# Patient Record
Sex: Male | Born: 1965 | Race: White | Hispanic: No | Marital: Married | Smoking: Former smoker
Health system: Southern US, Community
[De-identification: ages and names within clinical notes are randomized; demographics above are authoritative.]

## PROBLEM LIST (undated history)

## (undated) DIAGNOSIS — R079 Chest pain, unspecified: Secondary | ICD-10-CM

## (undated) DIAGNOSIS — M549 Dorsalgia, unspecified: Secondary | ICD-10-CM

## (undated) DIAGNOSIS — E119 Type 2 diabetes mellitus without complications: Secondary | ICD-10-CM

## (undated) DIAGNOSIS — F32A Depression, unspecified: Secondary | ICD-10-CM

## (undated) DIAGNOSIS — M7702 Medial epicondylitis, left elbow: Secondary | ICD-10-CM

## (undated) DIAGNOSIS — L0292 Furuncle, unspecified: Secondary | ICD-10-CM

## (undated) DIAGNOSIS — N529 Male erectile dysfunction, unspecified: Secondary | ICD-10-CM

## (undated) DIAGNOSIS — Z720 Tobacco use: Secondary | ICD-10-CM

## (undated) DIAGNOSIS — J189 Pneumonia, unspecified organism: Secondary | ICD-10-CM

## (undated) DIAGNOSIS — R002 Palpitations: Secondary | ICD-10-CM

## (undated) DIAGNOSIS — Z9989 Dependence on other enabling machines and devices: Secondary | ICD-10-CM

## (undated) DIAGNOSIS — G43909 Migraine, unspecified, not intractable, without status migrainosus: Secondary | ICD-10-CM

## (undated) DIAGNOSIS — I10 Essential (primary) hypertension: Secondary | ICD-10-CM

## (undated) DIAGNOSIS — M109 Gout, unspecified: Secondary | ICD-10-CM

## (undated) DIAGNOSIS — E785 Hyperlipidemia, unspecified: Secondary | ICD-10-CM

## (undated) DIAGNOSIS — E669 Obesity, unspecified: Secondary | ICD-10-CM

## (undated) DIAGNOSIS — F329 Major depressive disorder, single episode, unspecified: Secondary | ICD-10-CM

## (undated) DIAGNOSIS — G4733 Obstructive sleep apnea (adult) (pediatric): Secondary | ICD-10-CM

## (undated) DIAGNOSIS — R972 Elevated prostate specific antigen [PSA]: Secondary | ICD-10-CM

## (undated) DIAGNOSIS — C61 Malignant neoplasm of prostate: Secondary | ICD-10-CM

## (undated) DIAGNOSIS — M7701 Medial epicondylitis, right elbow: Secondary | ICD-10-CM

## (undated) DIAGNOSIS — K219 Gastro-esophageal reflux disease without esophagitis: Secondary | ICD-10-CM

## (undated) HISTORY — PX: DENTAL SURGERY: SHX609

## (undated) HISTORY — PX: PROSTATE BIOPSY: SHX241

## (undated) HISTORY — DX: Obesity, unspecified: E66.9

## (undated) HISTORY — DX: Tobacco use: Z72.0

---

## 2008-08-11 ENCOUNTER — Ambulatory Visit: Payer: Self-pay | Admitting: Cardiology

## 2008-08-11 ENCOUNTER — Observation Stay (HOSPITAL_COMMUNITY): Admission: EM | Admit: 2008-08-11 | Discharge: 2008-08-12 | Payer: Self-pay | Admitting: Emergency Medicine

## 2009-12-19 ENCOUNTER — Emergency Department (HOSPITAL_COMMUNITY): Admission: EM | Admit: 2009-12-19 | Discharge: 2009-12-19 | Payer: Self-pay | Admitting: Emergency Medicine

## 2011-02-01 NOTE — H&P (Signed)
NAME:  Shawn Hicks, Shawn Hicks NO.:  [id number]   MEDICAL RECORD NO.:  [id number]          PATIENT TYPE:  OBV   LOCATION:  2004                         FACILITY:  [facility]   PHYSICIAN:  Arturo Morton. Riley Kill, MD, FACCDATE OF BIRTH:  1966-09-12   DATE OF ADMISSION:  08/11/2008  DATE OF DISCHARGE:                              HISTORY & PHYSICAL   CHIEF COMPLAINT:  Chest discomfort/agitation.   HISTORY OF PRESENT ILLNESS:  Last night, near midnight, the patient  became aware of a sensation in the substernal area of his chest.  It was  not a pain and nor was it associated with shortness of breath, no  radiation, however, it did interfere with his sleep and he was aware of  this pain all night.  He became agitated and this agitation is what has  brought him in to his primary care in the morning.  He reported at no  time during the evening did he have palpitations, dizziness, shortness  of breath, diaphoresis, nausea, or vomiting, got worse, this discomfort  was 2/10 and it came on at rest and spontaneously resolved in the a.m.   PAST MEDICAL HISTORY:  Hypertension.   ALLERGIES:  He has no known drug allergies.  No allergies of any kind.   MEDICATIONS:  1. Atenolol 25 mg daily.  2. Hydrochlorothiazide 25 mg daily.   SOCIAL HISTORY:  The patient lives in [place] with his wife.  They  have children, but they are grown and do not live at home.  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  He works full  time.  He is also active in the home, but has no regular exercise.  He  has a 30-pack year history of smoking, but quit yesterday.  He drinks 6-  8 alcoholic beverages a week.  No drugs in the last 5 years and prior to  that only marijuana.  His diet is regular.  No modification.  He does  not take any herbal medications.   FAMILY HISTORY:  His mother is deceased.  She died at 75 from liver  failure.  His father is living, only known medical history is  prostate  cancer.  He has 3 siblings with no known medical history.   REVIEW OF SYSTEMS:  The patient has intermittent night sweats for the  past several months, pain in his joints.  He has very, very brief  episodes of lasting less than 2 seconds of dizziness associated with  standing or sitting quickly.   PHYSICAL EXAMINATION:  VITAL SIGNS:  Temperature at 97.8 degrees  Fahrenheit, pulse of 55, respiration rate of 22, blood pressure was  133/85.  His oxygen saturation was 100% on 2 L via nasal cannula.  GENERAL:  He was alert and oriented x3.  He was in no apparent distress.  HEENT:  He was normocephalic and atraumatic.  His pupils were equal,  round, and reactive to light.  His extraocular muscles were intact.  NECK:  Supple without lymphadenopathy.  He had no thyroid megaly, no  bruits, and no JVD.  No  lymphadenopathy was noticed in any of his  extremities.  HEART:  His heart rate was regular rate and regular rhythm.  He had an  audible S1 and S2 with no murmurs, rubs, or gallops.  Pulses were 2+ and  equal without bruits in upper extremities and lower extremities  including carotid.  LUNGS:  Had mild wheezing bilaterally with right greater than left, but  significantly decreased after the patient coughed.  SKIN:  The patient had no rashes or skin lesion.  The patient was obese.  ABDOMEN:  Soft, nontender with normal abdominal sounds.  No rebound or  guarding, and no hepatosplenomegaly.  EXTREMITIES:  There was no clubbing, cyanosis, or edema.  No rashes,  lesions, or petechiae.  MUSCULOSKELETAL:  There is no joint deformity or effusions.  NEUROLOGIC:  He was A&O x3.  His cranial nerves II through XII were  grossly intact.  Strength was 5/5 in extremities and had normal  sensation throughout and showed no cerebellar dysfunction.   He had a chest x-ray that showed no active disease.  His EKG shows sinus  bradycardia with rate of 51, normal axis, PR was 166, QRS 84, QTc 423,  no  hypertrophy, no ST-T changes, and no Q waves.   LABORATORY DATA:  White blood cells 10, hemoglobin 14.8, hematocrit  43.4, platelets 163.  Sodium 138, potassium 4.0, chloride 105, carbon  dioxide 25, BUN 14, creatinine 1.1, glucose 104.  He had 1 set of  negative cardiac enzymes.  CK was 147, CK-MB was 3.3, and troponin I was  0.01.  APTT was 27 seconds, PT was 12.9, INR was 1.0.   ASSESSMENT AND PLAN:  The patient will be admitted to [facility] and had been  ruled out for acute coronary symptoms.  His admitting diagnosis be  unstable angina.  The patient will have a CMET, TSH, PT, INR.  He will  have 2 more sets of cardiac enzymes, lipid panel, one more EKG in the  morning and the patient will be scheduled for treadmill Myoview in the  a.m.  The patient also had extensive patient education regarding  lifestyle changes and encouragement to continue smoking cessation.  The  pathophysiology of coronary artery disease was also pointed out by Dr.  Riley Kill.  The patient was asked and had no question at that time;  however, he will be checked on for question from the future prior to  discharge.      Jarrett Ables, PAC      Arturo Morton. Riley Kill, MD, [facility]  Electronically Signed    MS/MEDQ  D:  08/11/2008  T:  08/12/2008  Job:  [id number]

## 2011-02-01 NOTE — Discharge Summary (Signed)
NAME:  Shawn Hicks, Shawn Hicks NO.:  [id number]   MEDICAL RECORD NO.:  [id number]          PATIENT TYPE:  OBV   LOCATION:  2004                         FACILITY:  [facility]   PHYSICIAN:  Verne Carrow, MDDATE OF BIRTH:  11-02-1965   DATE OF ADMISSION:  08/11/2008  DATE OF DISCHARGE:  08/12/2008                               DISCHARGE SUMMARY   The patient currently does not have a primary cardiologist.   PRIMARY CARE DOCTOR:  Dr. Pleas Koch.   DISCHARGE DIAGNOSIS:  Noncardiac chest pain, most likely secondary to  gastroesophageal reflux disease.   SECONDARY DIAGNOSIS:  Hypertension.   ALLERGIES:  The patient has no known drug allergies.   PROCEDURES PERFORMED DURING THIS HOSPITALIZATION:  The patient had an  electrocardiogram performed on August 12, 2008, which showed a normal  sinus rhythm at the rate of 64.  The patient had a chest x-ray that  showed no active disease.  On August 12, 2008, the patient had a  treadmill stress test with Myoview that was normal with hypertensive  response to exercise and an ejection fraction of 70%.   HISTORY OF PRESENT ILLNESS:  On the night of August 10, 2008, the  patient became aware of a sensation in his chest in the substernal area  that was nonradiating and nonassociated with shortness of breath,  nausea, palpitations, dizziness, and lasted throughout the night  interfering with his sleep and causing him to become more and more  agitated to the point where he felt it is necessary to see his primary  care Kanen Mottola in the morning.  The sensation in his chest started at  rest and resolved spontaneously in the morning just prior to going to  see his primary care Cashis Rill.  His primary care Latesha Chesney instructed him  to go to the emergency room and he was subsequently evaluated for acute  coronary syndrome at [facility].  The patient was stable  throughout the course of his hospital stay.  He had 3 sets of negative  cardiac enzymes and negative stress test and had no EKG changes.  He was  instructed upon his discharge on August 12, 2008, to follow up with  his primary care Otelia Hettinger as soon as it is convenient.  At the time of  his discharge, his systolic blood pressure was 121.  His diastolic blood  pressure was 73, temperature was 96, pulse 63, and respirations 20.  His  O2 saturation was 99% on room air.   LABORATORY VALUES:  White blood cell count was 10.0, hemoglobin was  14.8, hematocrit was 43.4, and platelet count was 163.  Protime was  12.9.  INR was 1.0.  PTT was 27.  Sodium was 138, potassium 4.0,  chloride 105, CO2 of 24, BUN 13, creatinine 0.91, glucose 106.  His  hemoglobin A1c was 6.0.  CK was 160.  CK-MB was 3.9 and 1.8.  Relative  index, troponin I was 2.02 on his last set of cardiac markers.  BNP was  244.0.  Total cholesterol was 158.  Triglyceride - LIPR 351.  Cholesterol, HDL was 24.  Cholesterol,  LDL was 64.  Cholesterol, VLDL  was 70.  TSH was 2.022.   FOLLOWUP APPOINTMENTS:  The patient was instructed to follow up with his  primary care physician.   DISCHARGE MEDICATIONS:  1. The patient was discharged on atenolol 25 mg daily.  2. Hydrochlorothiazide 25 mg daily.   OUTSTANDING LABS AND STUDIES:  None.   DURATION OF DISCHARGE/ENCOUNTER:  45 minutes according to physician  time.      Jarrett Ables, [facility]      Verne Carrow, MD  Electronically Signed    MS/MEDQ  D:  08/12/2008  T:  08/12/2008  Job:  [id number]   cc:   Dr. Pleas Koch

## 2011-06-21 LAB — LIPID PANEL
Cholesterol: 158
HDL: 24 — ABNORMAL LOW
Total CHOL/HDL Ratio: 6.6
Triglycerides: 351 — ABNORMAL HIGH

## 2011-06-21 LAB — CBC
HCT: 43.4
Platelets: 163
RDW: 12.8
WBC: 10

## 2011-06-21 LAB — COMPREHENSIVE METABOLIC PANEL
ALT: 39
Alkaline Phosphatase: 50
BUN: 13
Calcium: 8.7
Creatinine, Ser: 0.91
GFR calc Af Amer: >60
GFR calc non Af Amer: >60
Potassium: 4
Sodium: 138
Total Bilirubin: 0.6
Total Protein: 6.4

## 2011-06-21 LAB — POCT I-STAT, CHEM 8
BUN: 14
Creatinine, Ser: 1.1
HCT: 44
Hemoglobin: 15

## 2011-06-21 LAB — PROTIME-INR
INR: 1
Prothrombin Time: 12.9

## 2011-06-21 LAB — DIFFERENTIAL
Basophils Absolute: 0.1
Basophils Relative: 1
Eosinophils Absolute: 0.4
Eosinophils Relative: 4
Lymphs Abs: 3.6
Monocytes Absolute: 0.6
Monocytes Relative: 6
Neutro Abs: 5.2
Neutrophils Relative %: 53

## 2011-06-21 LAB — TROPONIN I: Troponin I: 0.01

## 2011-06-21 LAB — HEMOGLOBIN A1C: Hgb A1c MFr Bld: 6

## 2011-06-21 LAB — CK TOTAL AND CKMB (NOT AT ARMC): CK, MB: 3.3

## 2011-06-21 LAB — POCT CARDIAC MARKERS: Myoglobin, poc: 98

## 2011-06-21 LAB — CARDIAC PANEL(CRET KIN+CKTOT+MB+TROPI): CK, MB: 2.9

## 2013-07-17 ENCOUNTER — Encounter (HOSPITAL_COMMUNITY): Payer: Self-pay | Admitting: Emergency Medicine

## 2013-07-17 ENCOUNTER — Emergency Department (HOSPITAL_COMMUNITY)
Admission: EM | Admit: 2013-07-17 | Discharge: 2013-07-17 | Disposition: A | Discharge status: Home / Self Care | Payer: Managed Care, Other (non HMO) | Attending: Emergency Medicine | Admitting: Emergency Medicine

## 2013-07-17 DIAGNOSIS — R002 Palpitations: Secondary | ICD-10-CM | POA: Insufficient documentation

## 2013-07-17 DIAGNOSIS — Z79899 Other long term (current) drug therapy: Secondary | ICD-10-CM | POA: Insufficient documentation

## 2013-07-17 DIAGNOSIS — E871 Hypo-osmolality and hyponatremia: Secondary | ICD-10-CM

## 2013-07-17 DIAGNOSIS — E119 Type 2 diabetes mellitus without complications: Secondary | ICD-10-CM | POA: Insufficient documentation

## 2013-07-17 DIAGNOSIS — I10 Essential (primary) hypertension: Secondary | ICD-10-CM | POA: Insufficient documentation

## 2013-07-17 DIAGNOSIS — F17200 Nicotine dependence, unspecified, uncomplicated: Secondary | ICD-10-CM | POA: Insufficient documentation

## 2013-07-17 DIAGNOSIS — Z7982 Long term (current) use of aspirin: Secondary | ICD-10-CM | POA: Insufficient documentation

## 2013-07-17 HISTORY — DX: Type 2 diabetes mellitus without complications: E11.9

## 2013-07-17 HISTORY — DX: Essential (primary) hypertension: I10

## 2013-07-17 LAB — CBC WITH DIFFERENTIAL/PLATELET
Basophils Absolute: 0.1 10*3/uL (ref 0.0–0.1)
Eosinophils Absolute: 0.3 10*3/uL (ref 0.0–0.7)
Eosinophils Relative: 3 % (ref 0–5)
HCT: 45.5 % (ref 39.0–52.0)
Lymphs Abs: 3.9 10*3/uL (ref 0.7–4.0)
MCH: 32 pg (ref 26.0–34.0)
MCHC: 34.7 g/dL (ref 30.0–36.0)
MCV: 92.1 fL (ref 78.0–100.0)
Monocytes Absolute: 0.7 10*3/uL (ref 0.1–1.0)
Monocytes Relative: 7 % (ref 3–12)
Neutro Abs: 4.5 10*3/uL (ref 1.7–7.7)
Neutrophils Relative %: 48 % (ref 43–77)
Platelets: 179 10*3/uL (ref 150–400)
RBC: 4.94 MIL/uL (ref 4.22–5.81)
RDW: 13.3 % (ref 11.5–15.5)

## 2013-07-17 LAB — BASIC METABOLIC PANEL
BUN: 14 mg/dL (ref 6–23)
CO2: 22 mEq/L (ref 19–32)
Calcium: 9.4 mg/dL (ref 8.4–10.5)
Chloride: 96 mEq/L (ref 96–112)
Creatinine, Ser: 0.91 mg/dL (ref 0.50–1.35)
GFR calc Af Amer: >90 mL/min (ref >90)
GFR calc non Af Amer: >90 mL/min (ref >90)
Glucose, Bld: 122 mg/dL — ABNORMAL HIGH (ref 70–99)

## 2013-07-17 LAB — POCT I-STAT TROPONIN I: Troponin i, poc: 0 ng/mL (ref 0.00–0.08)

## 2013-07-17 NOTE — ED Notes (Signed)
Pt denies pain at this time. Does feel it in his chest when his heart skips a beat.

## 2013-07-17 NOTE — ED Notes (Signed)
Pt states that around 2am this morning after he got off work he started feeling like his heart was skipping beats and continued on throughout the day. Pt states he is on the edge of being dizzy but not dizzy, dizzy. Pt denies n/v or shob.

## 2013-07-17 NOTE — Progress Notes (Signed)
Patient confirms his pcp is DR. Rodrigo Ran of [facility].  System updated.

## 2013-07-17 NOTE — ED Provider Notes (Signed)
CSN: [id number]     Arrival date & time 07/17/13  1421 History   First MD Initiated Contact with Patient 07/17/13 1621     Chief Complaint  Patient presents with  . heart skipping beats    (Consider location/radiation/quality/duration/timing/severity/associated sxs/prior Treatment) The history is provided by the patient.  Shawn Hicks is a 47 y.o. male history of hypertension, diabetes here presenting with palpitations. The eye he has irregular heartbeat around 2 AM when he got off of work. Denies shortness of breath or worse palpitation with exertion. Has intermittent palpitations for some time. Had similar episodes before and had normal stress test. Denies chest pain or hx of CAD.    Past Medical History  Diagnosis Date  . Hypertension   . Diabetes mellitus without complication    History reviewed. No pertinent past surgical history. No family history on file. History  Substance Use Topics  . Smoking status: Current Every Day Smoker    Types: Cigars  . Smokeless tobacco: Never Used  . Alcohol Use: Yes     Comment: occasion    Review of Systems  Cardiovascular: Positive for palpitations.  All other systems reviewed and are negative.    Allergies  Review of patient's allergies indicates no known allergies.  Home Medications   Current Outpatient Rx  Name  Route  Sig  Dispense  Refill  . amLODipine (NORVASC) 5 MG tablet   Oral   Take 1 tablet by mouth daily.         Marland Kitchen aspirin 325 MG tablet   Oral   Take 325 mg by mouth daily.         Marland Kitchen lisinopril (PRINIVIL,ZESTRIL) 40 MG tablet   Oral   Take 40 mg by mouth daily.         . metFORMIN (GLUCOPHAGE) 500 MG tablet   Oral   Take 1 tablet by mouth daily.          Marland Kitchen testosterone (ANDROGEL) 50 MG/5GM GEL   Transdermal   Place 5 g onto the skin daily.          BP 126/75  Pulse 67  Temp(Src) 98.1 F (36.7 C) (Oral)  Resp 12  SpO2 100% Physical Exam  Nursing note and vitals  reviewed. Constitutional: He is oriented to person, place, and time. He appears well-developed and well-nourished.  HENT:  Head: Normocephalic.  Mouth/Throat: Oropharynx is clear and moist.  Eyes: Conjunctivae are normal. Pupils are equal, round, and reactive to light.  Neck: Normal range of motion. Neck supple.  Cardiovascular: Normal rate, regular rhythm and normal heart sounds.   Pulmonary/Chest: Effort normal and breath sounds normal. No respiratory distress. He has no wheezes. He has no rales.  Abdominal: Soft. Bowel sounds are normal. He exhibits no distension. There is no tenderness. There is no rebound and no guarding.  Musculoskeletal: Normal range of motion.  Neurological: He is alert and oriented to person, place, and time.  Skin: Skin is warm and dry.  Psychiatric: He has a normal mood and affect. His behavior is normal. Judgment and thought content normal.    ED Course  Procedures (including critical care time) Labs Review Labs Reviewed  BASIC METABOLIC PANEL - Abnormal; Notable for the following:    Sodium 129 (*)    Glucose, Bld 122 (*)    All other components within normal limits  CBC WITH DIFFERENTIAL  CBC WITH DIFFERENTIAL  CBC WITH DIFFERENTIAL  POCT I-STAT TROPONIN I   Imaging Review  No results found.  EKG Interpretation     Ventricular Rate:  76 PR Interval:  150 QRS Duration: 75 QT Interval:  349 QTC Calculation: 392 R Axis:   31 Text Interpretation:  Normal sinus rhythm No significant change since last tracing            MDM  No diagnosis found. Shawn Hicks is a 47 y.o. male here with palpitations. Will check electrolytes. He is not in heart failure. Stable to have workup outpatient.   6:09 PM Na 129. Likely from dehydration. Recommend outpatient holter monitor.    Richardean Canal, MD 07/17/13 [phone]

## 2013-07-31 ENCOUNTER — Ambulatory Visit (INDEPENDENT_AMBULATORY_CARE_PROVIDER_SITE_OTHER): Payer: Managed Care, Other (non HMO) | Admitting: Cardiovascular Disease

## 2013-07-31 ENCOUNTER — Encounter: Payer: Self-pay | Admitting: Cardiovascular Disease

## 2013-07-31 VITALS — BP 126/76 | HR 77 | Resp 15 | Ht 69.0 in | Wt 229.4 lb

## 2013-07-31 DIAGNOSIS — E871 Hypo-osmolality and hyponatremia: Secondary | ICD-10-CM | POA: Insufficient documentation

## 2013-07-31 DIAGNOSIS — E669 Obesity, unspecified: Secondary | ICD-10-CM

## 2013-07-31 DIAGNOSIS — R002 Palpitations: Secondary | ICD-10-CM

## 2013-07-31 NOTE — Progress Notes (Signed)
Patient ID: Shawn Hicks, male   DOB: 25-Jun-1966, 47 y.o.   MRN: [id number]      Reason for office visit Palpitations  Shawn Hicks is a moderately obese 47 year old man with hypertension, borderline diabetes mellitus and hypertriglyceridemia who was recently seen in the emergency room with palpitations. He describes the sensation very precise manner, like a skipped beat occurring about every fourth beat. He felt mildly dizzy. He was seen in the left lung emergency room. Laboratory tests are significant for hyponatremia (sodium 129) but were otherwise normal, and his echocardiogram showed which were a troponin contractions. He has some musculoskeletal chest discomfort related to an old injury and he fell down the stairs and had a fractured sternum. He does not describe anginal type chest pain but does have some shortness of breath which she attributes to obesity. 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 He denies presyncope, focal neurological complaints, intermittent claudication, lower extremity edema, erectile dysfunction or other cardiovascular issues. His palpitations resolved by the following morning and have not recurred. A few years ago he underwent a treadmill stress test that was normal   No Known Allergies  Current Outpatient Prescriptions  Medication Sig Dispense Refill  . amLODipine (NORVASC) 5 MG tablet Take 1 tablet by mouth daily.      Marland Kitchen aspirin 325 MG tablet Take 325 mg by mouth daily as needed.       Shawn Hicks Seed OIL 1 capsule by Does not apply route daily.      Marland Kitchen lisinopril (PRINIVIL,ZESTRIL) 40 MG tablet Take 40 mg by mouth daily.      . metFORMIN (GLUCOPHAGE) 500 MG tablet Take 1 tablet by mouth daily.       Marland Kitchen testosterone (ANDROGEL) 50 MG/5GM GEL Place 5 g onto the skin daily.       No current facility-administered medications for this visit.    Past Medical History  Diagnosis Date  .  Hypertension   . Diabetes mellitus without complication     No past surgical history on file.  Family History  Problem Relation Age of Onset  . Liver disease Mother   . Cancer Father   . Heart attack Father     History   Social History  . Marital Status: Married    Spouse Name: N/A    Number of Children: N/A  . Years of Education: N/A   Occupational History  . Not on file.   Social History Main Topics  . Smoking status: Current Every Day Smoker    Types: Cigars  . Smokeless tobacco: Never Used  . Alcohol Use: Yes     Comment: occasion  . Drug Use: No  . Sexual Activity: Not on file   Other Topics Concern  . Not on file   Social History Narrative  . No narrative on file    Review of systems: The patient specifically denies any chest pain at rest or with exertion, dyspnea at rest or with exertion, orthopnea, paroxysmal nocturnal dyspnea, syncope, focal neurological deficits, intermittent claudication, lower extremity edema, unexplained weight gain, cough, hemoptysis or wheezing.  The patient also denies abdominal pain, nausea, vomiting, dysphagia, diarrhea, constipation, polyuria, polydipsia, dysuria, hematuria, frequency, urgency, abnormal bleeding or bruising, fever, chills, unexpected weight changes, mood swings, change in skin or hair texture, change in voice quality, auditory or visual problems, allergic reactions or rashes, new musculoskeletal complaints other than usual "aches and  pains".   PHYSICAL EXAM BP 126/76  Pulse 77  Resp 15  Ht 5\' 9"  (1.753 m)  Wt 229 lb 6.4 oz (104.055 kg)  BMI 33.86 kg/m2  General: Alert, oriented x3, no distress, likely obese Head: no evidence of trauma, PERRL, EOMI, no exophtalmos or lid lag, no myxedema, no xanthelasma; normal ears, nose and oropharynx Neck: Very broad neck circumference, normal jugular venous pulsations and no hepatojugular reflux; brisk carotid pulses without delay and no carotid bruits Chest: clear to  auscultation, no signs of consolidation by percussion or palpation, normal fremitus, symmetrical and full respiratory excursions Cardiovascular: normal position and quality of the apical impulse, regular rhythm, normal first and second heart sounds, no murmurs, rubs or gallops Abdomen: no tenderness or distention, no masses by palpation, no abnormal pulsatility or arterial bruits, normal bowel sounds, no hepatosplenomegaly Extremities: no clubbing, cyanosis or edema; 2+ radial, ulnar and brachial pulses bilaterally; 2+ right femoral, posterior tibial and dorsalis pedis pulses; 2+ left femoral, posterior tibial and dorsalis pedis pulses; no subclavian or femoral bruits Neurological: grossly nonfocal   EKG: When seen in the emergency department the ECG was normal except for PACs; his ECG today is completely normal.  Lipid Panel     Component Value Date/Time   CHOL  Value: 158        ATP III CLASSIFICATION:  <200     mg/dL   Desirable  696-295  mg/dL   Borderline High  >=284    mg/dL   High 13/24/4010 2725   TRIG 351* 08/11/2008 1445   HDL 24* 08/11/2008 1445   CHOLHDL 6.6 08/11/2008 1445   VLDL 70* 08/11/2008 1445   LDLCALC  Value: 64        Total Cholesterol/HDL:CHD Risk Coronary Heart Disease Risk Table                     Men   Women  1/2 Average Risk   3.4   3.3 08/11/2008 1445    BMET    Component Value Date/Time   NA 129* 07/17/2013 1622   K 4.2 07/17/2013 1622   CL 96 07/17/2013 1622   CO2 22 07/17/2013 1622   GLUCOSE 122* 07/17/2013 1622   BUN 14 07/17/2013 1622   CREATININE 0.91 07/17/2013 1622   CALCIUM 9.4 07/17/2013 1622   GFRNONAA >90 07/17/2013 1622   GFRAA >90 07/17/2013 1622     ASSESSMENT AND PLAN Palpitations Fingers pretty good documentation that his palpitations are related to premature atrial contractions. I doubt we will find out more data by doing a 24-hour Holter monitor. The cause for his PACs is unclear. He doesn't describe symptoms that sound like atrial  fibrillation. He has an incomplete symptom complex for obstructive sleep apnea. I have recommended that we perform an echocardiogram to look at the size of his left and right atria and see if there is any evidence of pulmonary hypertension/cor pulmonale. I don't think I would investigate the premature beats any further if the echo is normal. We discussed the fact that the premature beats are more likely to happen during the hyperadrenergic state, excessive caffeine consumption, insufficient sleep etc.  Obesity Spent quite a while discussing his ideal body weight, the concept of body mass index and healthy ranges, waist to achieve weight loss, avoiding excessive carbohydrates (especially simple sugars and carbohydrates with a high glycemic index), types of exercise, etc. 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 It loss of the expected to have a highly beneficial effect   if his echocardiogram shows right heart enlargement, I recommend sleep study. Orders Placed This Encounter  Procedures  . EKG 12-Lead  . 2D Echocardiogram without contrast   Meds ordered this encounter  Medications  . Celery Seed OIL    Sig: 1 capsule by Does not apply route daily.    Junious Silk, MD, [facility] [phone] office [phone] pager

## 2013-07-31 NOTE — Patient Instructions (Signed)
Your physician has requested that you have an echocardiogram. Echocardiography is a painless test that uses sound waves to create images of your heart. It provides your doctor with information about the size and shape of your heart and how well your heart's chambers and valves are working. This procedure takes approximately one hour. There are no restrictions for this procedure.  Your physician recommends that you schedule a follow-up appointment if needed for abnormalities on the echocardiogram

## 2013-07-31 NOTE — Assessment & Plan Note (Addendum)
Spent quite a while discussing his ideal body weight, the concept of body mass index and healthy ranges, waist to achieve weight loss, avoiding excessive carbohydrates (especially simple sugars and carbohydrates with a high glycemic index), types of exercise, etc. 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 It loss of the expected to have a highly beneficial effect

## 2013-07-31 NOTE — Assessment & Plan Note (Addendum)
We have pretty good documentation that his palpitations are related to premature atrial contractions. I doubt we will find out more data by doing a 24-hour Holter monitor. The cause for his PACs is unclear. He doesn't describe symptoms that sound like atrial fibrillation. He has an incomplete symptom complex for obstructive sleep apnea. I have recommended that we perform an echocardiogram to look at the size of his left and right atria and see if there is any evidence of pulmonary hypertension/cor pulmonale. I don't think I would investigate the premature beats any further if the echo is normal. We discussed the fact that the premature beats are more likely to happen during the hyperadrenergic state, excessive caffeine consumption, insufficient sleep etc.

## 2013-08-26 ENCOUNTER — Other Ambulatory Visit (HOSPITAL_COMMUNITY): Payer: Self-pay | Admitting: Cardiovascular Disease

## 2013-08-26 ENCOUNTER — Ambulatory Visit (HOSPITAL_COMMUNITY)
Admission: RE | Admit: 2013-08-26 | Discharge: 2013-08-26 | Disposition: A | Discharge status: Home / Self Care | Payer: Managed Care, Other (non HMO) | Source: Ambulatory Visit | Attending: Cardiovascular Disease | Admitting: Cardiovascular Disease

## 2013-08-26 DIAGNOSIS — R0602 Shortness of breath: Secondary | ICD-10-CM

## 2013-08-26 DIAGNOSIS — R002 Palpitations: Secondary | ICD-10-CM

## 2013-08-26 DIAGNOSIS — E669 Obesity, unspecified: Secondary | ICD-10-CM | POA: Insufficient documentation

## 2013-08-26 DIAGNOSIS — I10 Essential (primary) hypertension: Secondary | ICD-10-CM | POA: Insufficient documentation

## 2013-08-26 DIAGNOSIS — R7309 Other abnormal glucose: Secondary | ICD-10-CM | POA: Insufficient documentation

## 2013-08-26 NOTE — Progress Notes (Signed)
2D Echo Performed 10/16/2012    Dawnmarie Breon, RCS  

## 2014-08-27 ENCOUNTER — Ambulatory Visit (INDEPENDENT_AMBULATORY_CARE_PROVIDER_SITE_OTHER): Payer: Managed Care, Other (non HMO) | Admitting: Physician Assistant

## 2014-08-27 ENCOUNTER — Encounter: Payer: Self-pay | Admitting: Physician Assistant

## 2014-08-27 VITALS — BP 110/80 | HR 80 | Ht 69.0 in | Wt 237.7 lb

## 2014-08-27 DIAGNOSIS — R079 Chest pain, unspecified: Secondary | ICD-10-CM | POA: Insufficient documentation

## 2014-08-27 DIAGNOSIS — E669 Obesity, unspecified: Secondary | ICD-10-CM

## 2014-08-27 DIAGNOSIS — Z720 Tobacco use: Secondary | ICD-10-CM | POA: Insufficient documentation

## 2014-08-27 DIAGNOSIS — I10 Essential (primary) hypertension: Secondary | ICD-10-CM

## 2014-08-27 DIAGNOSIS — E119 Type 2 diabetes mellitus without complications: Secondary | ICD-10-CM | POA: Insufficient documentation

## 2014-08-27 MED ORDER — NITROGLYCERIN 0.4 MG SL SUBL: 0.4000 mg | SUBLINGUAL_TABLET | SUBLINGUAL | Status: AC | PRN

## 2014-08-27 NOTE — Assessment & Plan Note (Signed)
Cessation advised. 

## 2014-08-27 NOTE — Assessment & Plan Note (Addendum)
Patient's chest pain is fairly constant and does not appear to be worsened with exertion. His risk factors include obesity, tobacco abuse, family history, diabetes mellitus.  We'll obtain a treadmill Myoview. Also provide him with some sublingual nitroglycerin. Just in case. He did have a negative d-dimer and EKG does not show any ischemic changes however, does show some left atrial enlargement.

## 2014-08-27 NOTE — Patient Instructions (Addendum)
Your physician recommends that you schedule a follow-up appointment in: 1 Month with [facility]  Your physician has requested that you have en exercise stress myoview. For further information please visit [email or web address]. Please follow instruction sheet, as given.

## 2014-08-27 NOTE — Addendum Note (Signed)
Addended by: Vennie Homans on: 08/27/2014 10:51 AM   Modules accepted: Orders

## 2014-08-27 NOTE — Assessment & Plan Note (Signed)
Metformin 

## 2014-08-27 NOTE — Progress Notes (Signed)
Patient ID: Shawn Hicks, male   DOB: 1966-09-02, 48 y.o.   MRN: [id number]    Date:  08/27/2014   ID:  Shawn Hicks, DOB 01/21/1966, MRN [id number]  PCP:  Shawn Ly, MD  Primary Cardiologist:  new     History of Present Illness: Shawn Hicks is a 48 y.o. male history of obesity, hypertension, diabetes mellitus, tobacco abuse. His father is currently 25 and does have coronary artery disease. Patient reports having chest pain over in the left axilla for approximately 1.5 weeks. It is 1-2 out of 10 and fairly constant. He does get busy forget these having it not notice it. Last Tuesday he did a lot of work a lot of lifting this past Monday did notice a dull ache in his left arm which resolved. Nothing particular makes the pain better or worse. He denies nausea, vomiting, diaphoresis, shortness of breath, radiation of pain.  He did report feeling just a little bit dizzy one day.  The patient currently denies  fever,  orthopnea, dizziness, PND, cough, congestion, abdominal pain, hematochezia, melena, lower extremity edema, claudication.  Wt Readings from Last 3 Encounters:  08/27/14 237 lb 11.2 oz (107.82 kg)  07/31/13 229 lb 6.4 oz (104.055 kg)     Past Medical History  Diagnosis Date  . Hypertension   . Diabetes mellitus without complication   . Obesity   . Tobacco abuse     Current Outpatient Prescriptions  Medication Sig Dispense Refill  . amLODipine (NORVASC) 5 MG tablet Take 1 tablet by mouth daily.    Marland Kitchen aspirin 325 MG tablet Take 325 mg by mouth daily as needed.     Marland Kitchen atorvastatin (LIPITOR) 40 MG tablet Take 1 tablet by mouth daily.    Antoine Poche Seed OIL 1 capsule by Does not apply route daily.    Marland Kitchen lisinopril (PRINIVIL,ZESTRIL) 40 MG tablet Take 40 mg by mouth daily.    . metFORMIN (GLUCOPHAGE) 500 MG tablet Take 1 tablet by mouth daily.     Marland Kitchen omeprazole (PRILOSEC) 20 MG capsule Take 1 capsule by mouth daily.     No current facility-administered medications for this  visit.    Allergies:   No Known Allergies  Social History:  The patient  reports that he has been smoking Cigars.  He has never used smokeless tobacco. He reports that he drinks alcohol. He reports that he does not use illicit drugs.   Family history:   Family History  Problem Relation Age of Onset  . Liver disease Mother   . Cancer Father   . Heart attack Father     ROS:  Please see the history of present illness.  All other systems reviewed and negative.   PHYSICAL EXAM: VS:  BP 110/80 mmHg  Pulse 80  Ht 5\' 9"  (1.753 m)  Wt 237 lb 11.2 oz (107.82 kg)  BMI 35.09 kg/m2 obese, well developed, in no acute distress HEENT: Pupils are equal round react to light accommodation extraocular movements are intact.  Neck: no JVDNo cervical lymphadenopathy. Cardiac: Regular rate and rhythm without murmurs rubs or gallops. Lungs:  clear to auscultation bilaterally, no wheezing,  mild rhonchi  Abd: soft, nontender, positive bowel sounds all quadrants, no hepatosplenomegaly Ext: no lower extremity edema.  2+ radial and dorsalis pedis pulses. Skin: warm and dry Neuro:  Grossly normal  EKG:  Normal sinus rhythm, atrial enlargement.  ASSESSMENT AND PLAN:  Problem List Items Addressed This Visit    Chest pain -  Primary    Patient's chest pain is fairly constant and does not appear to be worsened with exertion. His risk factors include obesity, tobacco abuse, family history, diabetes mellitus.  We'll obtain a treadmill Myoview. Also provide him with some sublingual nitroglycerin. Just in case. He did have a negative d-dimer and EKG does not show any ischemic changes however, does show some left atrial enlargement.      Relevant Orders      Myocardial Perfusion Imaging   Essential hypertension    Blood pressure well-controlled    Relevant Medications      atorvastatin (LIPITOR) 40 MG tablet   Obesity   Relevant Orders      Myocardial Perfusion Imaging   Type 2 diabetes mellitus     Metformin    Relevant Medications      atorvastatin (LIPITOR) 40 MG tablet

## 2014-08-27 NOTE — Assessment & Plan Note (Signed)
Blood pressure well controlled

## 2014-08-29 ENCOUNTER — Encounter: Payer: Self-pay | Admitting: Cardiovascular Disease

## 2014-09-10 ENCOUNTER — Telehealth: Payer: Self-pay | Admitting: Cardiovascular Disease

## 2014-09-10 NOTE — Telephone Encounter (Signed)
Close encounter 

## 2014-09-24 ENCOUNTER — Telehealth (HOSPITAL_COMMUNITY): Payer: Self-pay

## 2014-09-24 NOTE — Telephone Encounter (Signed)
Encounter complete. 

## 2014-09-26 ENCOUNTER — Encounter (HOSPITAL_COMMUNITY): Payer: Managed Care, Other (non HMO)

## 2014-10-03 ENCOUNTER — Ambulatory Visit: Payer: Managed Care, Other (non HMO) | Admitting: Physician Assistant

## 2014-10-31 ENCOUNTER — Ambulatory Visit: Payer: Managed Care, Other (non HMO) | Admitting: Physician Assistant

## 2014-11-07 ENCOUNTER — Encounter: Payer: Self-pay | Admitting: Neurology

## 2014-11-10 ENCOUNTER — Ambulatory Visit (INDEPENDENT_AMBULATORY_CARE_PROVIDER_SITE_OTHER): Payer: Managed Care, Other (non HMO) | Admitting: Neurology

## 2014-11-10 ENCOUNTER — Encounter: Payer: Self-pay | Admitting: Neurology

## 2014-11-10 VITALS — BP 110/64 | HR 72 | Resp 21 | Ht 68.0 in | Wt 240.0 lb

## 2014-11-10 DIAGNOSIS — R0683 Snoring: Secondary | ICD-10-CM | POA: Insufficient documentation

## 2014-11-10 DIAGNOSIS — I10 Essential (primary) hypertension: Secondary | ICD-10-CM | POA: Insufficient documentation

## 2014-11-10 DIAGNOSIS — G4726 Circadian rhythm sleep disorder, shift work type: Secondary | ICD-10-CM | POA: Insufficient documentation

## 2014-11-10 DIAGNOSIS — G4719 Other hypersomnia: Secondary | ICD-10-CM

## 2014-11-10 NOTE — Progress Notes (Signed)
[facility]   Provider:  Larey Seat, M D  Referring Provider: Jerlyn Ly, MD Primary Care Physician:  Shawn Ly, MD  Chief Complaint  Patient presents with  . Snoring    Rm 11 New Patient - Works hours 3:30 pm untill 2:00 am     HPI:  Shawn Hicks is a 49 y.o. male ,  seen here as a referral from Dr. Joylene Hicks for a sleep consultation , 11-10-14 Mr.  Hicks  is also followed by Shawn Hicks, whom he recently saw for an perceived atypical chest pain.  He is a current every day smoker, was diagnosed with bronchitis, sinusitis just  a couple of weeks ago.  The patient states that he has been snoring for many years perhaps decades, and that he snores louder when he had a couple of drinks which is expected. 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 He has broken his nose a couple of times in his late teens, some of these alcohol related.  His wife has been aware of his snoring as long as they're married, but until recently also not aware that this could be relating to a health problem. [name] states  he became excessively daytime sleepy; when he sits down to watch TV, he dozes off. He falls asleep whenever not stimulated and physically inactive.  He noted longer distance driving puts him into drowsiness and he at work he is greateful for being  physically active, he fell asleep in a meeting, though.   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 Mo through Friday for the last 9 years . 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 He never had trouble sleeping.  He usually goes to bed between 4 and 4 .30 AM and is asleep immediately, has one nocturia at night. He fells best sleeping  Prone, moves a lot , wakes up on his back. He has back pain. He uses an alarm for 1 PM and gets ready for work.  The patient reports having a lot of un-scheduled naps in daytime and often feels that he  is going to dream quite easily.  He has vivid dreams at night as well. Most of them are pleasant not nightmarish in character. He has not been a sleep walker, acting out dreams, or  yelling , nor sleep eating .  No children live in the house. His bedroom is cool, quiet ( fan running  ) and dark.  He would consider himself not an early bird , except  on vacation he actually enjoys getting up early and enjoy the full time of being off.  He has coffee when he gets up, 1-2 cups.   His is currently he is smoking cigars about 3 a day cutting down. On weekends he will drink alcohol whiskey he orally on Monday nights will drink bourbon by watching his favorite TV show,  no history of any illicit drug use. One brother and 2 sisters, all his senior, all living and nobody with diagnosed with OSA, his father had a Heart attack and had a stent placed, doing well., prostrate cancer .   His mother was an alcoholic and died of complications from liver disease.    He had no facial, neck or ENT surgeries.    Review of Systems: Out of a complete 14 system  review, the patient complains of only the following symptoms, and all other reviewed systems are negative.  chest pain, arm pain on the left- recovered after chiropractic adjustment. Coughing.   Epworth score  17 , Fatigue severity score 38  , depression score 1   History   Social History  . Marital Status: Married    Spouse Name: Shawn Hicks  . Number of Children: 0  . Years of Education: college   Occupational History  . Not on file.   Social History Main Topics  . Smoking status: Current Every Day Smoker    Types: Cigars  . Smokeless tobacco: Never Used  . Alcohol Use: 1.8 oz/week    3 Shots of liquor per week     Comment: On Monday's after work  . Drug Use: No  . Sexual Activity: Not on file   Other Topics Concern  . Not on file   Social History Narrative   Patient lives at home with his wife Shawn Hicks) and his father.   Patient works full time  Works hours 3:00 pm until 2:oo am every night.   Education college.    Right handed.   Caffeine Two cups of coffee daily and one mountain dew daily.    Family History  Problem Relation Age of Onset  . Liver disease Mother   . Cancer Father   . Heart attack Father     Past Medical History  Diagnosis Date  . Hypertension   . Diabetes mellitus without complication   . Obesity   . Tobacco abuse     Past Surgical History  Procedure Laterality Date  . Dental surgery      Current Outpatient Prescriptions  Medication Sig Dispense Refill  . amLODipine (NORVASC) 5 MG tablet Take 1 tablet by mouth daily.    Marland Kitchen aspirin 325 MG tablet Take 81 mg by mouth daily as needed.     Marland Kitchen atorvastatin (LIPITOR) 40 MG tablet Take 1 tablet by mouth daily.    Shawn Hicks Seed OIL 1 capsule by Does not apply route daily.    . colchicine 0.6 MG tablet Take 0.6 mg by mouth daily.    Marland Kitchen lisinopril (PRINIVIL,ZESTRIL) 40 MG tablet Take 40 mg by mouth daily.    . metFORMIN (GLUCOPHAGE) 500 MG tablet Take 1 tablet by mouth daily.     . Misc Natural Products (BLACK CHERRY CONCENTRATE PO) Take by mouth daily.    . nitroGLYCERIN (NITROSTAT) 0.4 MG SL tablet Place 1 tablet (0.4 mg total) under the tongue every 5 (five) minutes as needed for chest pain. 25 tablet 3  . omeprazole (PRILOSEC) 20 MG capsule Take 1 capsule by mouth daily.     No current facility-administered medications for this visit.    Allergies as of 11/10/2014  . (No Known Allergies)    Vitals: BP 110/64 mmHg  Pulse 72  Resp 21  Ht 5\' 8"  (1.727 m)  Wt 240 lb (108.863 kg)  BMI 36.50 kg/m2 Last Weight:  Wt Readings from Last 1 Encounters:  11/10/14 240 lb (108.863 kg)       Last Height:   Ht Readings from Last 1 Encounters:  11/10/14 5\' 8"  (1.727 m)    Physical exam:  General: The patient is awake, alert and appears not in acute distress. The patient is well groomed. Head: Normocephalic, atraumatic. Neck is supple. Mallampati 3 , very  red  Mucous lining.   neck circumference: 18 Nasal airflow restricted, congested, coughing, bronchial breathing, TMJ is  Not  evident .  Postnasal drip, Retrognathia is seen. Full dentures on top for 5 years.  Cardiovascular:  Regular rate and rhythm , without  murmurs or carotid bruit, and without distended neck veins. Respiratory: Lungs are clear to auscultation. Skin:  Without evidence of edema, or rash Trunk: BMI is elevated but  patient has normal posture.  Neurologic exam : The patient is awake and alert, oriented to place and time.   Memory subjective described as intact. There is a normal attention span & concentration ability.  Speech is fluent without dysarthria, but dysphonia . Mood and affect are appropriate.  Cranial nerves: Pupils are equal and briskly reactive to light. Funduscopic exam without evidence of pallor or edema.  Extraocular movements  in vertical and horizontal planes intact and without nystagmus. Visual fields by finger perimetry are intact. Hearing to finger rub intact.  Facial sensation intact to fine touch. Facial motor strength is symmetric and tongue and uvula move midline.  Motor exam:  Normal tone, muscle bulk and symmetric ,strength in all extremities.  Sensory:  Fine touch, pinprick and vibration were tested in all extremities. Proprioception is tested in the upper extremities only. This was normal.  Coordination: Rapid alternating movements in the fingers/hands is normal. Finger-to-nose maneuver  normal without evidence of ataxia, dysmetria or tremor.  Gait and station: Patient walks without assistive device and is able unassisted to climb up to the exam table. Strength within normal limits. Stance is stable and normal.  Tandem gait is unfragmented.  Deep tendon reflexes: in the  upper and lower extremities are symmetric and intact. Babinski maneuver response is downgoing.   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 This is quite common in shift workers that have to sleep in daytime when outside activities cost noise, light or any other interruptions that they're not always aware of.   #2 he has risk factors for obstructive sleep apnea, BMI, retrognathia ,  and currently suffers from an upper airway infection and inflammation that forces him even more to breathe through his mouth. He  has swelling of the oral and pharyngeal tissues. This may not be his baseline sleep breathing pattern that I see today. I think currently he is likely to not just snore but have some obstruction and apnea as well.  #3 he is still smoking also on a very limited scale. Consider that he has a many year smoking history he may have some alveolar hypoventilation. I will order for this patient a split-night polysomnography study with CO2 measures, he is insured through [employer] and I will ask for a split-night at 15 AHI, scoring at 3%, main reason excessive hypersomnia associated with snoring and a shift worker.  I explained to this patient that obstructive sleep apnea can be a risk factor for heart disease and since he recently had an evaluation for chest pain  (which turned out not to be cardiac induced) he came to our radar screen.   The patient was advised of the nature of the diagnosed sleep disorder , the treatment options and risks for general a health and wellness arising from not treating the condition. Consult duration was 30  minutes.   Plan:  Treatment plan and additional  workup : SPLIT .      Asencion Partridge Morad Tal MD  11/10/2014

## 2014-11-10 NOTE — Patient Instructions (Signed)
Sleep Apnea  Sleep apnea is disorder that affects a person's sleep. A person with sleep apnea has abnormal pauses in their breathing when they sleep. It is hard for them to get a good sleep. This makes a person tired during the day. It also can lead to other physical problems. There are three types of sleep apnea. One type is when breathing stops for a short time because your airway is blocked (obstructive sleep apnea). Another type is when the brain sometimes fails to give the normal signal to breathe to the muscles that control your breathing (central sleep apnea). The third type is a combination of the other two types.  HOME CARE  · Do not sleep on your back. Try to sleep on your side.  · Take all medicine as told by your doctor.  · Avoid alcohol, calming medicines (sedatives), and depressant drugs.  · Try to lose weight if you are overweight. Talk to your doctor about a healthy weight goal.  Your doctor may have you use a device that helps to open your airway. It can help you get the air that you need. It is called a positive airway pressure (PAP) device. There are three types of PAP devices:  · Continuous positive airway pressure (CPAP) device.  · Nasal expiratory positive airway pressure (EPAP) device.  · Bilevel positive airway pressure (BPAP) device.  MAKE SURE YOU:  · Understand these instructions.  · Will watch your condition.  · Will get help right away if you are not doing well or get worse.  Document Released: 06/14/2008 Document Revised: 08/22/2012 Document Reviewed: 01/07/2012  [employer]® Patient Information ©2015 [employer]. This information is not intended to replace advice given to you by your health care provider. Make sure you discuss any questions you have with your health care provider.

## 2014-12-29 ENCOUNTER — Other Ambulatory Visit: Payer: Self-pay | Admitting: Neurology

## 2014-12-29 DIAGNOSIS — G4733 Obstructive sleep apnea (adult) (pediatric): Secondary | ICD-10-CM

## 2015-01-02 ENCOUNTER — Encounter: Payer: Managed Care, Other (non HMO) | Admitting: *Deleted

## 2016-11-30 ENCOUNTER — Telehealth: Payer: Self-pay | Admitting: Student

## 2016-11-30 NOTE — Telephone Encounter (Signed)
Received records from [facility] for appointment on 12/12/16 with Bernerd Pho, [place].  Records put with Brittany's schedule for 12/12/16. lp

## 2016-12-01 ENCOUNTER — Telehealth: Payer: Self-pay

## 2016-12-01 DIAGNOSIS — R0683 Snoring: Secondary | ICD-10-CM

## 2016-12-01 NOTE — Telephone Encounter (Addendum)
This patient had consult with Dr. Brett Fairy on 11/10/2014 , he cancelled a scheduled split and scheduled home study.  I now have a new referral for him, can orders be put back in or does he need to schedule another consult?

## 2016-12-01 NOTE — Telephone Encounter (Signed)
I would be willing to just re order the HST, but if he not shows for this one, He is dismissed.  Does he have an appointment already? CD

## 2016-12-01 NOTE — Telephone Encounter (Signed)
I can put order back in, unless insurance changed. CD

## 2016-12-11 NOTE — Progress Notes (Signed)
Cardiology Office Note    Date:  12/12/2016   ID:  Shawn Hicks, DOB January 01, 1966, MRN [id number]  PCP:  Jerlyn Ly, MD  Cardiologist: Dr. Sallyanne Kuster  Chief Complaint  Patient presents with  . Follow-up    chest pain, and palpitations, no other complaints    History of Present Illness:    Shawn Hicks is a 51 y.o. male with past medical history of HTN, Type 2 DM, and tobacco use who presents to the office today for evaluation of chest pain and palpitations.   He was last seen by Tarri Fuller, PA-C in 08/2014 and reported having constant low-grade chest pain for the past 1.5 weeks. EKG was without acute ischemic changes. A Treadmill Myoview was ordered but this was never performed.   Was seen by his PCP on 11/30/2016 and reported having a "weird" sensation in his chest which started 2-3 days after taking Xigduo. Reported associated dizziness and a fullness along his fingertips. Labs were obtained at that time and showed WBC 10.1, Hgb 18.3, platelets 222. Creatinine 1.0, K+ 4.5. EKG showed NSR, HR 79, with no acute ST or T-wave changes.   In talking with the patient today, he reports having episodes of chest discomfort for the past 2 weeks. He describes this as a dull pain which usually occurs with rest. The pain can last for several minutes up to an hour and gradually resides. He denies any exertional chest pain and notes his current pain is noticeable when siting upright or lying down, actually improving with exertion. Denies any associated dyspnea, nausea, vomiting, or diaphoresis.  He also reports having palpitations occurring during this time frame as well. Says he can feel his heart "skip beats" which he notices mostly at night when it is quiet. He denies any associated lightheadedness, dizziness, or presyncope.   He denies any orthopnea, PND, or lower extremity edema.   He did recently quit smoking cigars to see if this would help with his symptoms, which has not helped. He was  advised to continue cessation of tobacco products.   The patient also reports he quit taking his Xigduo for the past week to see if this would improve his symptoms and he has not noticed any recent changes. Was informed to resume this and follow-up with his PCP if he wishes to switch to a different medication.    Past Medical History:  Diagnosis Date  . Diabetes mellitus without complication ([place])   . Hypertension   . Obesity   . Tobacco abuse     Past Surgical History:  Procedure Laterality Date  . DENTAL SURGERY      Current Medications: Outpatient Medications Prior to Visit  Medication Sig Dispense Refill  . amLODipine (NORVASC) 5 MG tablet Take 1 tablet by mouth daily.    Marland Kitchen aspirin 325 MG tablet Take 81 mg by mouth daily as needed.     Marland Kitchen atorvastatin (LIPITOR) 40 MG tablet Take 1 tablet by mouth daily.    Antoine Poche Seed OIL 1 capsule by Does not apply route daily.    . colchicine 0.6 MG tablet Take 0.6 mg by mouth daily.    Marland Kitchen lisinopril (PRINIVIL,ZESTRIL) 40 MG tablet Take 40 mg by mouth daily.    . metFORMIN (GLUCOPHAGE) 500 MG tablet Take 1 tablet by mouth daily.     . Misc Natural Products (BLACK CHERRY CONCENTRATE PO) Take by mouth daily.    . nitroGLYCERIN (NITROSTAT) 0.4 MG SL tablet Place 1 tablet (  0.4 mg total) under the tongue every 5 (five) minutes as needed for chest pain. 25 tablet 3  . omeprazole (PRILOSEC) 20 MG capsule Take 1 capsule by mouth daily.     No facility-administered medications prior to visit.      Allergies:   Patient has no known allergies.   Social History   Social History  . Marital status: Married    Spouse name: Vicky  . Number of children: 0  . Years of education: college   Social History Main Topics  . Smoking status: Current Every Day Smoker    Types: Cigars  . Smokeless tobacco: Never Used  . Alcohol use 1.8 oz/week    3 Shots of liquor per week     Comment: On Monday's after work  . Drug use: No  . Sexual activity: Not  Asked   Other Topics Concern  . None   Social History Narrative   Patient lives at home with his wife Jocelyn Lamer) and his father.   Patient works full time Works hours 3:00 pm until 2:oo am every night.   Education college.    Right handed.   Caffeine Two cups of coffee daily and one mountain dew daily.     Family History:  The patient's family history includes Cancer in his father; Heart attack in his father; Liver disease in his mother; Sleep apnea in his brother.   Review of Systems:   Please see the history of present illness.     General:  No chills, fever, night sweats or weight changes.  Cardiovascular:  No dyspnea on exertion, edema, orthopnea, paroxysmal nocturnal dyspnea. Positive for chest pain and palpitations.  Dermatological: No rash, lesions/masses Respiratory: No cough, dyspnea Urologic: No hematuria, dysuria Abdominal:   No nausea, vomiting, diarrhea, bright red blood per rectum, melena, or hematemesis Neurologic:  No visual changes, wkns, changes in mental status. All other systems reviewed and are otherwise negative except as noted above.   Physical Exam:    VS:  BP 127/78   Pulse 64   Ht 5\' 9"  (1.753 m)   Wt 227 lb (103 kg)   SpO2 99%   BMI 33.52 kg/m    General: Well developed, well nourished Caucasian male appearing in no acute distress. Head: Normocephalic, atraumatic, sclera non-icteric, no xanthomas, nares are without discharge.  Neck: No carotid bruits. JVD not elevated.  Lungs: Respirations regular and unlabored, without wheezes or rales.  Heart: Regular rate and rhythm. No S3 or S4.  No murmur, no friction rubs, or gallops appreciated. Abdomen: Soft, non-tender, non-distended with normoactive bowel sounds. No hepatomegaly. No rebound/guarding. No obvious abdominal masses. Msk:  Strength and tone appear normal for age. No joint deformities or effusions. Extremities: No clubbing or cyanosis. No edema.  Distal pedal pulses are 2+ bilaterally. Neuro:  Alert and oriented X 3. Moves all extremities spontaneously. No focal deficits noted. Psych:  Responds to questions appropriately with a normal affect. Skin: No rashes or lesions noted  Wt Readings from Last 3 Encounters:  12/12/16 227 lb (103 kg)  11/10/14 240 lb (108.9 kg)  08/27/14 237 lb 11.2 oz (107.8 kg)      Studies/Labs Reviewed:   EKG:  EKG is ordered today. The ekg ordered today demonstrates NSR, HR 64, no acute ST or T-wave changes when compared to prior tracings.   Recent Labs: No results found for requested labs within last 8760 hours.   Additional studies/ records that were reviewed today include:   NST:  01/2009 The patient exercised for 7 minutes and 3 seconds on a standard  Bruce protocol. Exercise was stopped due to fatigue. Maximal  heart rate was 150. Peak blood pressure was 209/74. Resting EKG  was normal. With stress there is no ischemia.     Scintigraphic results: Images reinsert the short axis vertical  horizontal long axis. There is significant motion artifact. There  appear to be left ventricular hypertrophy. There is no evidence of  ischemia or infarction. Surface imaging showed normal ejection  fraction of 70%.     Final impression: Normal stress Myoview with hypertensive response  to exercise. Ejection fraction 70%  Echocardiogram: 08/2013 Study Conclusions  Left ventricle: The cavity size was normal. Systolic function was normal. The estimated ejection fraction was in the range of 60% to 65%. Wall motion was normal; there were no regional wall motion abnormalities.  Assessment:    1. Chest pain, unspecified type   2. Pleuritic chest pain   3. Palpitations   4. Essential hypertension   5. Type 2 diabetes mellitus without complication, without long-term current use of insulin ([place])   6. Tobacco abuse      Plan:   In order of problems listed above:  1. Pleuritic Chest Pain - he reports having episodes of chest discomfort for the  past 2 weeks which he describes as a dull ache which can last for several minutes up to an hour and gradually resides. Pain is brought on by siting upright or lying down, actually improving with exertion. Denies any associated dyspnea, nausea, vomiting, or diaphoresis - EKG today is without any acute ischemic changes. His symptoms seem atypical for ACS as they can last for over an hour and are improved with exertion however he does have several cardiac risk factors including HTN, Type 2 DM, and family history of CAD.  - will obtain an echocardiogram to assess for structural abnormalities of the heart and to rule out a pericardial effusion. He has in-date SL NTG and instructions for taking this were reviewed with the patient. Was informed if he develops persistent chest pain or exertional symptoms, then he should proceed to the nearest ED.   2. Palpitations - reports episodes of his heart "skipping beats" along with his heart racing. Episodes occur 1-2 times per week. No associated lightheadedness, dizziness, or presyncope. Recent labs reviewed with stable electrolytes. Hgb 18.3. Reports TSH was checked at his annual appointment and was "normal".  - Will obtain a 30-day cardiac event monitor to assess for arrhythmias.   3. Essential HTN - BP well-controlled at 127/78 during today's visit. - reports good compliance with his Lisinopril. Has not been taking Amlodipine due to episodes of hypotension. Was encouraged to continue to check BP closely and resume Amlodipine if SBP > 140 or DBP > 90.  4. Type 2 DM - he quit taking Xigduo for the past week to see if this would improve his symptoms and he has not noticed any recent changes.  - informed the patient to resume this and follow-up with his PCP for alternatives to this medication if he does not wish to remain on it.   5. Tobacco Use - quit smoking cigarettes 2+ years ago and quit using cigars last week.  - continued cessation advised.      Medication Adjustments/Labs and Tests Ordered: Current medicines are reviewed at length with the patient today.  Concerns regarding medicines are outlined above.  Medication changes, Labs and Tests ordered today are listed in the  Patient Instructions below. Patient Instructions  Medication Instructions:  Your physician recommends that you continue on your current medications as directed. Please refer to the Current Medication list given to you today.  Labwork: None   Testing/Procedures: Your physician has requested that you have an echocardiogram. Echocardiography is a painless test that uses sound waves to create images of your heart. It provides your doctor with information about the size and shape of your heart and how well your heart's chambers and valves are working. This procedure takes approximately one hour. There are no restrictions for this procedure.  Your physician has recommended that you wear an 30 DAY event monitor. Event monitors are medical devices that record the heart's electrical activity. Doctors most often [place] these monitors to diagnose arrhythmias. Arrhythmias are problems with the speed or rhythm of the heartbeat. The monitor is a small, portable device. You can wear one while you do your normal daily activities. This is usually used to diagnose what is causing palpitations/syncope (passing out).  Follow-Up: Your physician recommends that you schedule a follow-up appointment in: [address] [place].  Any Other Special Instructions Will Be Listed Below (If Applicable).  If you need a refill on your cardiac medications before your next appointment, please call your pharmacy.   Signed, Erma Heritage, PA  12/12/2016 1:39 PM    [facility] [place], [place], [place]  [address] Phone: [phone]; Fax: [phone]  [address] [place], [place] [address] Phone: [phone]

## 2016-12-12 ENCOUNTER — Ambulatory Visit (INDEPENDENT_AMBULATORY_CARE_PROVIDER_SITE_OTHER): Payer: Managed Care, Other (non HMO) | Admitting: Student

## 2016-12-12 ENCOUNTER — Encounter (INDEPENDENT_AMBULATORY_CARE_PROVIDER_SITE_OTHER): Payer: Self-pay

## 2016-12-12 ENCOUNTER — Encounter: Payer: Self-pay | Admitting: Student

## 2016-12-12 VITALS — BP 127/78 | HR 64 | Ht 69.0 in | Wt 227.0 lb

## 2016-12-12 DIAGNOSIS — I10 Essential (primary) hypertension: Secondary | ICD-10-CM

## 2016-12-12 DIAGNOSIS — Z720 Tobacco use: Secondary | ICD-10-CM | POA: Diagnosis not present

## 2016-12-12 DIAGNOSIS — R002 Palpitations: Secondary | ICD-10-CM | POA: Diagnosis not present

## 2016-12-12 DIAGNOSIS — R079 Chest pain, unspecified: Secondary | ICD-10-CM

## 2016-12-12 DIAGNOSIS — E119 Type 2 diabetes mellitus without complications: Secondary | ICD-10-CM | POA: Diagnosis not present

## 2016-12-12 DIAGNOSIS — R0781 Pleurodynia: Secondary | ICD-10-CM

## 2016-12-12 NOTE — Patient Instructions (Signed)
Medication Instructions:  Your physician recommends that you continue on your current medications as directed. Please refer to the Current Medication list given to you today.  Labwork: None   Testing/Procedures: Your physician has requested that you have an echocardiogram. Echocardiography is a painless test that uses sound waves to create images of your heart. It provides your doctor with information about the size and shape of your heart and how well your heart's chambers and valves are working. This procedure takes approximately one hour. There are no restrictions for this procedure.  Your physician has recommended that you wear an 30 DAY event monitor. Event monitors are medical devices that record the heart's electrical activity. Doctors most often [place] these monitors to diagnose arrhythmias. Arrhythmias are problems with the speed or rhythm of the heartbeat. The monitor is a small, portable device. You can wear one while you do your normal daily activities. This is usually used to diagnose what is causing palpitations/syncope (passing out).  Follow-Up: Your physician recommends that you schedule a follow-up appointment in: [place].  Any Other Special Instructions Will Be Listed Below (If Applicable).  If you need a refill on your cardiac medications before your next appointment, please call your pharmacy.

## 2016-12-12 NOTE — Progress Notes (Signed)
Agree. Not unreasonable to get a plain treadmill ECG test due to all his risk factors, not necessarily for the atypical pain. MCr

## 2016-12-23 ENCOUNTER — Telehealth: Payer: Self-pay | Admitting: Student

## 2016-12-23 NOTE — Telephone Encounter (Signed)
Those are very short-acting medications. Should not influence echocardiogram results. Fine to have echo on the same day. Thanks!

## 2016-12-23 NOTE — Telephone Encounter (Signed)
New message    Pt is calling to ask if nitrous oxide and novacain will effect the results of his echo. He has a dentist appt on Monday but before his echo Monday afternoon. He said he is about to go into work but to leave a message. If he should rs dentist appt.

## 2016-12-23 NOTE — Telephone Encounter (Signed)
Returned pts call and assured him that the Nitrous Oxide and Novocain are short acting medications and he can have both procedures done on the same day.  Pt verbalized appreciation for the call back.

## 2016-12-26 ENCOUNTER — Ambulatory Visit (HOSPITAL_COMMUNITY): Payer: Managed Care, Other (non HMO)

## 2016-12-26 ENCOUNTER — Ambulatory Visit (INDEPENDENT_AMBULATORY_CARE_PROVIDER_SITE_OTHER): Payer: Managed Care, Other (non HMO)

## 2016-12-26 ENCOUNTER — Telehealth (HOSPITAL_COMMUNITY): Payer: Self-pay | Admitting: Student

## 2016-12-26 DIAGNOSIS — R079 Chest pain, unspecified: Secondary | ICD-10-CM | POA: Diagnosis not present

## 2016-12-26 DIAGNOSIS — R002 Palpitations: Secondary | ICD-10-CM | POA: Diagnosis not present

## 2016-12-26 NOTE — Telephone Encounter (Signed)
12/26/2016 08:18 AM Phone (Outgoing) Shawn Hicks, Shawn Hicks (Self) [phone] (H)   Completed - Called pt and spoke with his father and he voiced that he would CB , pt was in the shower. Needs to be r/sed for today's Echo.. tech is out    By [employer]

## 2017-01-02 ENCOUNTER — Other Ambulatory Visit (HOSPITAL_COMMUNITY): Payer: Managed Care, Other (non HMO)

## 2017-01-04 ENCOUNTER — Ambulatory Visit (INDEPENDENT_AMBULATORY_CARE_PROVIDER_SITE_OTHER): Payer: Managed Care, Other (non HMO) | Admitting: Neurology

## 2017-01-04 DIAGNOSIS — G4710 Hypersomnia, unspecified: Secondary | ICD-10-CM

## 2017-01-04 DIAGNOSIS — G4733 Obstructive sleep apnea (adult) (pediatric): Secondary | ICD-10-CM

## 2017-01-04 DIAGNOSIS — G4731 Primary central sleep apnea: Secondary | ICD-10-CM

## 2017-01-04 DIAGNOSIS — R0683 Snoring: Secondary | ICD-10-CM

## 2017-01-04 DIAGNOSIS — E669 Obesity, unspecified: Secondary | ICD-10-CM

## 2017-01-06 ENCOUNTER — Other Ambulatory Visit: Payer: Self-pay

## 2017-01-06 ENCOUNTER — Ambulatory Visit (HOSPITAL_COMMUNITY): Payer: Managed Care, Other (non HMO) | Attending: Cardiology

## 2017-01-06 DIAGNOSIS — R079 Chest pain, unspecified: Secondary | ICD-10-CM | POA: Insufficient documentation

## 2017-01-06 DIAGNOSIS — R002 Palpitations: Secondary | ICD-10-CM | POA: Insufficient documentation

## 2017-01-06 DIAGNOSIS — I082 Rheumatic disorders of both aortic and tricuspid valves: Secondary | ICD-10-CM | POA: Insufficient documentation

## 2017-01-13 ENCOUNTER — Telehealth: Payer: Self-pay | Admitting: Cardiovascular Disease

## 2017-01-13 NOTE — Telephone Encounter (Signed)
Returned the phone call to the patient to inform him of the ECHO results:  Notes recorded by Erma Heritage, PA-C on 01/06/2017 at 4:26 PM EDT Please let the patient know his echocardiogram showed normal pumping function of the heart with no wall motion abnormalities. Does relax slightly abnormal which is expected with his known history of HTN. No significant valve abnormalities and no pericardial effusion noted (fluid around the heart). Reassuring study. Thank you!  He verbalized his understanding.

## 2017-01-13 NOTE — Telephone Encounter (Signed)
Follow Up ° ° ° °Returning call from earlier. Please call. °

## 2017-01-16 NOTE — Addendum Note (Signed)
Addended by: Larey Seat on: 01/16/2017 01:48 PM   Modules accepted: Orders

## 2017-01-16 NOTE — Procedures (Signed)
NAME: Talton Delpriore                         DOB: June 30, 1966 MEDICAL RECORD [id number]          DOS: 01/05/17 REFERRING PHYSICIAN: Crist Infante, MD Study performed: HST/Out of Center Sleep test HISTORY: Shawn Hicks is a 51 y.o. male Patient ,  seen here as a referral from Dr. 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 He has broken his nose a couple of times in his late teens. His wife has been aware of his snoring as long as they're married, but until recently also not aware that this could be relating to a health problem. Mr. Ostrom states he became excessively daytime sleepy;. He falls asleep whenever not stimulated or physically inactive. He noted that longer distance driving puts him into a drowsy state .He is physically active at work, but he fell asleep in a meeting.  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 Mo through Friday for the last 9 years . 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 He never had trouble sleeping.  He usually goes to bed between 4 and 4 .30 AM and is asleep immediately, has one Nocturia at night. He feels best sleeping prone, yet wakes up on his back. He has back pain. He uses an alarm for 1 PM and gets ready for work.  The patient reports having a lot of un-scheduled naps in daytime and often feels that he is going to dream quite easily.  He has vivid dreams at night as well. Most of them are pleasant not nightmarish in character. He has not been a sleep walker, is not acting out dreams, nor yelling, or sleep eating. He has coffee when he gets up, 1-2 cups.       Epworth Sleepiness Score endorsed at 17/ 24 points, Fatigue severity score at 38, depression score at 1 point.    STUDY RESULTS: Total Recording Time:  6h 71m.  Total Apnea/Hypopnea Index (AHI) was 64.8/ hr.  Average Oxygen Saturation: SpO2 86%. Lowest Oxygen  Saturation: nadir SpO2 at 55%. Hypoxemia for 224 minutes at or below 88% SpO2.  Average Heart Rate:  70 bpm, NSR, 53 to 114 bpm.  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  RECOMMENDATION: Immediate referral to CPAP therapy, but should see ENT for nasal obstruction treatment. I would much prefer an attended sleep study given the hypoxemia.  Patient's BMI is 37 and weight loss is recommended.      I certify that I have reviewed the raw data recording prior to the issuance of this report in accordance with the standards of Accreditation of the [place] [employer] (AASM) Larey Seat, MD    01-16-2017  Diplomat, [place] of Psychiatry and Neurology.Diplomat, Tax adviser of Sleep Medicine Market researcher of [employer] [facility] at [employer]

## 2017-01-17 ENCOUNTER — Telehealth: Payer: Self-pay | Admitting: Neurology

## 2017-01-17 DIAGNOSIS — G4733 Obstructive sleep apnea (adult) (pediatric): Secondary | ICD-10-CM

## 2017-01-17 NOTE — Telephone Encounter (Signed)
[employer] denied CPAP titration °

## 2017-01-17 NOTE — Addendum Note (Signed)
Addended by: Larey Seat on: 01/17/2017 05:22 PM   Modules accepted: Orders

## 2017-01-18 ENCOUNTER — Telehealth: Payer: Self-pay

## 2017-01-18 NOTE — Telephone Encounter (Signed)
I called pt to discuss his sleep study results. No answer, left a message asking him to call me back.  Of note, [employer] denied the titration study and Dr. Brett Fairy has ordered an auto pap.

## 2017-01-18 NOTE — Telephone Encounter (Signed)
-----   Message from Larey Seat, MD sent at 01/16/2017  1:48 PM EDT ----- Patient should return for attended CPAP titration, possible oxygen titration. This patient has severe OSA and complex sleep apnea as well a hypoxemia. CD

## 2017-01-18 NOTE — Telephone Encounter (Signed)
See phone note from 01/18/2017.

## 2017-01-18 NOTE — Telephone Encounter (Signed)
Pt returned my call. I advised him that his sleep study results showed that he has severe osa and complex sleep apnea with hypoxemia. Dr. Brett Fairy recommended that pt come for an attended titration study, however, [employer] denied this and therefore Dr. Brett Fairy ordered an auto pap. I reviewed PAP compliance expectations with the pt. Pt is agreeable to starting a cpap, and actually already has a cpap, he just needs it set, and for a mask fit. I advised him that I will send this order to [facility], a local DME. They will file with the pt's insurance and then call the pt within about one week to discuss set up. [facility] will show the pt how to use the cpap, etc. Pt is agreeable to following up with Dr. Brett Fairy on 05/08/2017 at 10:00am. Pt verbalized understanding of results. Pt had no questions at this time but was encouraged to call back if questions arise.

## 2017-01-27 ENCOUNTER — Telehealth: Payer: Self-pay

## 2017-01-27 NOTE — Telephone Encounter (Signed)
Notes recorded by Sanda Klein, MD on 01/26/2017 at 1:02 PM EDT No significant abnormal rhythms seen on the event monitor

## 2017-01-27 NOTE — Telephone Encounter (Signed)
lmtcb

## 2017-01-27 NOTE — Telephone Encounter (Signed)
New message     Pt was returning your call for test results

## 2017-01-27 NOTE — Telephone Encounter (Signed)
Called patient with results. Patient verbalized understanding. Patient reported that he still not feeling well, even though all studies came back normal. Patient states that he will start CPAP therapy next week. I scheduled him an appointment with MCr (per B Strader's office note from 12/12/16) on Tuesday, 04/18/17 at 9:40a. I advised patient to follow through with CPAP therapy and if he has continued problems to call the office.

## 2017-02-01 ENCOUNTER — Telehealth: Payer: Self-pay

## 2017-02-01 DIAGNOSIS — G4733 Obstructive sleep apnea (adult) (pediatric): Secondary | ICD-10-CM

## 2017-02-01 NOTE — Telephone Encounter (Signed)
Mellissa, RT from [facility] called the office. She reports that the pt has an ASV from his brother to use but this ASV cannot be set to auto pap pressures. It would need to be a set pressure. I spoke to Dr.Dohmeier and she gave me a verbal order to have pt do a auto pap 14 day rental set at 5-15 cm H2O and from those results, she will decide on a set pressure. I gave order to [facility], RT and will fax the order to [facility] as well.

## 2017-03-07 NOTE — Telephone Encounter (Signed)
Received this notice from [facility] when I inquired if they had the results of the ASV trial: "He has not came back in yet and I have been unsuccessful in contacting him to schedule him returning to get a download.  I called and Left him a VM  this morning"

## 2017-04-18 ENCOUNTER — Ambulatory Visit: Payer: Managed Care, Other (non HMO) | Admitting: Cardiovascular Disease

## 2017-05-07 ENCOUNTER — Encounter: Payer: Self-pay | Admitting: Neurology

## 2017-05-08 ENCOUNTER — Ambulatory Visit (INDEPENDENT_AMBULATORY_CARE_PROVIDER_SITE_OTHER): Payer: Managed Care, Other (non HMO) | Admitting: Neurology

## 2017-05-08 ENCOUNTER — Encounter: Payer: Self-pay | Admitting: Neurology

## 2017-05-08 ENCOUNTER — Encounter (INDEPENDENT_AMBULATORY_CARE_PROVIDER_SITE_OTHER): Payer: Self-pay

## 2017-05-08 VITALS — BP 98/70 | HR 75 | Ht 69.0 in | Wt 211.0 lb

## 2017-05-08 DIAGNOSIS — J439 Emphysema, unspecified: Secondary | ICD-10-CM | POA: Insufficient documentation

## 2017-05-08 DIAGNOSIS — G4731 Primary central sleep apnea: Secondary | ICD-10-CM

## 2017-05-08 DIAGNOSIS — Z9989 Dependence on other enabling machines and devices: Secondary | ICD-10-CM | POA: Diagnosis not present

## 2017-05-08 DIAGNOSIS — G4733 Obstructive sleep apnea (adult) (pediatric): Secondary | ICD-10-CM | POA: Diagnosis not present

## 2017-05-08 DIAGNOSIS — G4736 Sleep related hypoventilation in conditions classified elsewhere: Secondary | ICD-10-CM | POA: Diagnosis not present

## 2017-05-08 NOTE — Patient Instructions (Signed)

## 2017-05-08 NOTE — Progress Notes (Signed)
SLEEP MEDICINE CLINIC   Provider:  Larey Seat, M D  Referring Provider: Crist Infante, MD Primary Care Physician:  Crist Infante, MD  Chief Complaint  Patient presents with  . Follow-up    intital follow up with CPAP, pt says he is using this loaner CPAP until [employer] can get his brother's CPAP . pt said he has been dealing with a head cold for last month so he is unsure how beneficial it has been. i am unable to get a download at the moment. reaching out to [facility] asap and waiting to see if they can send me a report.     HPI:  URIAH PHILIPSON is a 51 y.o. male ,  Was last seen in 10-2014 after  a referral from Dr. Joylene Draft for a sleep consultation ,  Interval history from 05/08/2017. As the pleasure of seeing Mr. Elissa Hefty today who is just recovering from a pansinusitis, which has caused him to have failed to use CPAP every day. However over the last 14 days he has picked up and looking at the last 2 weeks his compliance has been good at 90%, with an average user time of 6 hours and 39 minutes, he is still on an AutoSet between 5 and 15 cm water and 3 cm EPR. His current machine is alone now from the durable medical equipment company [employer]. His residual apnea index is 4.0 there are no central apneas emergent, he does have some significant air leaks which also may relate to his recent head cold, and sinusitis. The patient is still very stuffed and congested, but his nasal passages now patent.  The patient's primary diagnosis of severe obstructive sleep apnea which was confirmed that a home sleep test on 01/05/2017. 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 He had severe complex sleep apnea with dominantly obstructive sleep  apnea and associated with low oxygen levels at night. The nadir for oxygen was 55%, the total desaturation time was 224 minutes, and his AHI was 64.8 per hour. He is using a nasal pillow now which he likes, his compliance download begun on August 5. He has not seen ENT, since his nose is now patent for airflow.  His daytime sleepiness has much improved, he doesn't struggle nearly as much anymore his current Epworth score is 8 points and his fatigue severity score 34 points down from 17 points on the Epworth sleepiness score and 38 on the fatigue severity score.   The patient did not endorse any depression issues, but he is on Prozac as he struggled with his fathers terminal lung cancer.    11-10-14 Mr.  Burleson  is also followed by Dr. Benn Moulder, whom he recently saw for an perceived atypical chest pain.  He is a current every day smoker, was diagnosed with bronchitis, sinusitis just  a couple of weeks ago.  The patient states that he has been snoring for many years perhaps decades, and that he snores louder when he had a couple of drinks which is expected. 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 He has broken his nose a couple of times in his late teens, some of these alcohol related.  His  wife has been aware of his snoring as long as they're married, but until recently also not aware that this could be relating to a health problem. Mr.  Mangione states  he became excessively daytime sleepy; when he sits down to watch TV, he dozes off. He falls asleep whenever not stimulated and physically inactive. 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 Mo through Friday for the last 9 years . 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 He never had trouble sleeping.  He  usually goes to bed between 4 and 4 .30 AM and is asleep immediately, has one nocturia at night. He fells best sleeping  Prone, moves a lot , wakes up on his back. He has back pain. He uses an alarm for 1 PM and gets ready for work.  The patient reports having a lot of un-scheduled naps in daytime and often feels that he is going to dream quite easily.  He has vivid dreams at night as well. Most of them are pleasant not nightmarish in character. He has not been a sleep walker, acting out dreams, or  yelling , nor sleep eating .No children live in the house. His bedroom is cool, quiet ( fan running  ) and dark. He would consider himself not an early bird, except  on vacation he actually enjoys getting up early and enjoy the full time of being off.  He has coffee when he gets up, 1-2 cups.   His is currently he is smoking cigars about 3 a day cutting down. On weekends he will drink alcohol whiskey he orally on Monday nights will drink bourbon by watching his favorite TV show,  no history of any illicit drug use. One brother and 2 sisters, all his senior, all living and nobody with diagnosed with OSA, his father had a Heart attack and had a stent placed, doing well., prostrate cancer .   His mother was an alcoholic and died of complications from liver disease.  He had no facial, neck or ENT surgeries.   Review of Systems: Out of a complete 14 system review, the patient complains of only the following symptoms, and all other reviewed systems are negative.  chest pain, arm pain on the left- recovered after chiropractic adjustment. Coughing.   Epworth score  8 , Fatigue severity score 34  , depression score 1   Social History   Social History  . Marital status: Married    Spouse name: Vicky  . Number of children: 0  . Years of education: college   Occupational History  . Not on file.   Social History Main Topics  . Smoking status: Current Every Day Smoker    Types: Cigars  . Smokeless tobacco:  Never Used  . Alcohol use 1.8 oz/week    3 Shots of liquor per week     Comment: On Monday's after work  . Drug use: No  . Sexual activity: Not on file   Other Topics Concern  . Not on file   Social History Narrative   Patient lives at home with his wife Jocelyn Lamer) and his father.   Patient works full time Works hours 3:00 pm until 2:oo am every night.  Education college.    Right handed.   Caffeine Two cups of coffee daily and one mountain dew daily.    Family History  Problem Relation Age of Onset  . Liver disease Mother   . Cancer Father   . Heart attack Father   . Sleep apnea Brother     Past Medical History:  Diagnosis Date  . Diabetes mellitus without complication ([place])   . Hypertension   . Obesity   . Tobacco abuse     Past Surgical History:  Procedure Laterality Date  . DENTAL SURGERY      Current Outpatient Prescriptions  Medication Sig Dispense Refill  . aspirin 325 MG tablet Take 81 mg by mouth daily as needed.     Marland Kitchen atorvastatin (LIPITOR) 40 MG tablet Take 1 tablet by mouth daily.    Antoine Poche Seed OIL 1 capsule by Does not apply route daily.    . colchicine 0.6 MG tablet Take 0.6 mg by mouth daily.    . Dapagliflozin-Metformin HCl ER (XIGDUO XR) 06-999 MG TB24 Take 1 tablet by mouth daily.    Marland Kitchen FLUoxetine (PROZAC) 10 MG tablet Take 10 mg by mouth daily.    Marland Kitchen lisinopril (PRINIVIL,ZESTRIL) 40 MG tablet Take 40 mg by mouth daily.    . Misc Natural Products (BLACK CHERRY CONCENTRATE PO) Take by mouth daily.    . nitroGLYCERIN (NITROSTAT) 0.4 MG SL tablet Place 1 tablet (0.4 mg total) under the tongue every 5 (five) minutes as needed for chest pain. 25 tablet 3  . omeprazole (PRILOSEC) 20 MG capsule Take 1 capsule by mouth daily.     No current facility-administered medications for this visit.     Allergies as of 05/08/2017  . (No Known Allergies)    Vitals: BP 98/70   Pulse 75   Ht 5\' 9"  (1.753 m)   Wt 211 lb (95.7 kg)   BMI 31.16 kg/m  Last  Weight:  Wt Readings from Last 1 Encounters:  05/08/17 211 lb (95.7 kg)       Last Height:   Ht Readings from Last 1 Encounters:  05/08/17 5\' 9"  (1.753 m)    Physical exam:  General: The patient is awake, alert and appears not in acute distress. The patient is well groomed. Head: Normocephalic, atraumatic. Neck is supple. Mallampati 3 , very red  Mucous lining.   neck circumference: 18 Nasal airflow patent , but his speech is nasal.   Postnasal drip, Retrognathia is seen.  Crowded lower jaw, Full dentures on top for 5 years.  Cardiovascular:  Regular rate and rhythm , without  murmurs or carotid bruit, and without distended neck veins. Respiratory: Lungs are clear to auscultation. Skin:  Without evidence of edema, or rash Trunk: BMI is elevated but  patient has normal posture.  Neurologic exam : The patient is awake and alert, oriented to place and time.   Cranial nerves: Pupils are equal and briskly reactive to light. Funduscopic exam without evidence of pallor or edema.  Extraocular movements  in vertical and horizontal planes intact and without nystagmus. Visual fields by finger perimetry are intact. Hearing to finger rub intact.  Facial sensation intact to fine touch. Facial motor strength is symmetric and tongue and uvula move midline. Motor exam:  Normal tone, muscle bulk and symmetric ,strength in all extremities. Deep tendon reflexes: in the upper and lower extremities are symmetric and intact. Babinski maneuver response is downgoing.  Assessment:  After physical and neurologic examination, review of  laboratory studies, imaging, neurophysiology testing and pre-existing records, assessment is    #1)This patient's hypersomnia was related to shift work and apnea.   #2) the patient's sleep apnea was surprisingly severe, and it was complex with dominant obstructive sleep apnea though. His prolonged hypoxemia is likely related to smoking, and could have been worsened temporarily by  bronchitis as well. Since he has experienced a reduction in daytime sleepiness since starting on CPAP I believe that his apnea was a mole main factor causing hypersomnia. As to his sinusitis he is now overcoming it. I will order an all and all, an overnight pulse oximetry while he uses his loaner CPAP. He is to contact our [facility] to get finally his own machine set. 95% would be 14 cm water.   # 3) he is still smoking also on a very limited scale. He needs to smoke, he has higher white and red blood cell count.   The patient was advised of the nature of the diagnosed sleep disorder , the treatment options and risks for general a health and wellness arising from not treating the condition. Consult duration was 30  minutes.   Plan:  ONO on CPAP, [facility] to set his new machine to 5-15 cm water .    Asencion Partridge Lorilee Cafarella MD  05/08/2017

## 2017-10-10 ENCOUNTER — Encounter: Payer: Self-pay | Admitting: Nurse Practitioner

## 2017-11-10 ENCOUNTER — Ambulatory Visit: Payer: Managed Care, Other (non HMO) | Admitting: Nurse Practitioner

## 2017-11-14 ENCOUNTER — Encounter: Payer: Self-pay | Admitting: Radiation Oncology

## 2017-12-12 ENCOUNTER — Encounter: Payer: Self-pay | Admitting: Radiation Oncology

## 2017-12-12 DIAGNOSIS — C61 Malignant neoplasm of prostate: Secondary | ICD-10-CM | POA: Insufficient documentation

## 2017-12-12 NOTE — Progress Notes (Signed)
GU Location of Tumor / Histology: prostatic adenocarcinoma  If Prostate Cancer, Gleason Score is (3 + 4) and PSA is (6.49). PSA of 6.49 was drawn 07/20/2017. Prostate volume: 26.95 grams  11/10/2017 PSA  5.45  Shawn Hicks was diagnosed with prostate cancer on 08/18/2017. He returned to Dr. Louis Meckel on 11/10/2017 for an interval follow up appointment.  Biopsies of prostate (if applicable) revealed:    Past/Anticipated interventions by urology, if any: prostate biopsy, referral to radiation oncology  Past/Anticipated interventions by medical oncology, if any: no  Weight changes, if any: no  Bowel/Bladder complaints, if any: IPSS 5.Denies dysuria, incontinence, leakage or hematuria.   Nausea/Vomiting, if any: no  Pain issues, if any:  Reports chronic neck, back and elbow pain. Reports pain is managed by a chiropractor.  SAFETY ISSUES:  Prior radiation? no  Pacemaker/ICD? no  Possible current pregnancy? no  Is the patient on methotrexate? no  Current Complaints / other details:  52 year old male. Married. No kids. Father with hx of prostate cancer. Mat.GF with hx of lung ca. Resides in [place]. Works second shift.

## 2017-12-13 ENCOUNTER — Ambulatory Visit
Admission: RE | Admit: 2017-12-13 | Discharge: 2017-12-13 | Disposition: A | Discharge status: Home / Self Care | Payer: Managed Care, Other (non HMO) | Source: Ambulatory Visit | Attending: Radiation Oncology | Admitting: Radiation Oncology

## 2017-12-13 ENCOUNTER — Other Ambulatory Visit: Payer: Self-pay

## 2017-12-13 ENCOUNTER — Encounter: Payer: Self-pay | Admitting: Radiation Oncology

## 2017-12-13 DIAGNOSIS — Z7982 Long term (current) use of aspirin: Secondary | ICD-10-CM | POA: Diagnosis not present

## 2017-12-13 DIAGNOSIS — C61 Malignant neoplasm of prostate: Secondary | ICD-10-CM | POA: Diagnosis not present

## 2017-12-13 DIAGNOSIS — Z87891 Personal history of nicotine dependence: Secondary | ICD-10-CM | POA: Insufficient documentation

## 2017-12-13 DIAGNOSIS — E119 Type 2 diabetes mellitus without complications: Secondary | ICD-10-CM | POA: Diagnosis not present

## 2017-12-13 DIAGNOSIS — Z801 Family history of malignant neoplasm of trachea, bronchus and lung: Secondary | ICD-10-CM | POA: Diagnosis not present

## 2017-12-13 DIAGNOSIS — Z9889 Other specified postprocedural states: Secondary | ICD-10-CM | POA: Insufficient documentation

## 2017-12-13 DIAGNOSIS — Z8042 Family history of malignant neoplasm of prostate: Secondary | ICD-10-CM | POA: Insufficient documentation

## 2017-12-13 DIAGNOSIS — E669 Obesity, unspecified: Secondary | ICD-10-CM | POA: Insufficient documentation

## 2017-12-13 DIAGNOSIS — Z79899 Other long term (current) drug therapy: Secondary | ICD-10-CM | POA: Insufficient documentation

## 2017-12-13 DIAGNOSIS — I10 Essential (primary) hypertension: Secondary | ICD-10-CM | POA: Insufficient documentation

## 2017-12-13 DIAGNOSIS — Z8249 Family history of ischemic heart disease and other diseases of the circulatory system: Secondary | ICD-10-CM | POA: Diagnosis not present

## 2017-12-13 HISTORY — DX: Malignant neoplasm of prostate: C61

## 2017-12-13 NOTE — Progress Notes (Signed)
See progress note under physician encounter. 

## 2017-12-13 NOTE — Progress Notes (Signed)
Radiation Oncology         [phone] ________________________________  Initial outpatient Consultation  Name: Shawn Hicks MRN: [id number]  Date: 12/13/2017  DOB: [birth year]  QB:HALPFX, Elta Guadeloupe, MD  Ardis Hughs, MD   REFERRING PHYSICIAN: Ardis Hughs, MD  DIAGNOSIS: 52 y.o. 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    ICD-10-CM   1. Malignant neoplasm of prostate ([place]) C61     HISTORY OF PRESENT ILLNESS: Shawn Hicks is a 52 y.o. male with a diagnosis of prostate cancer. He was noted to have an elevated PSA of 6.5 ion 05/25/18 by his primary care physician, Dr. Joylene Draft.  He has a prior history of elevated PSA dating back to 2015 with a PSA of 6.3.  He was evaluated by urology at that time and recommendation was to proceed with biopsy but the patient did not follow through.  His PSA has fluctuated since that time.  Accordingly, he was referred back to [facility] for evaluation with Dr. Louis Meckel on 04/24/2017,  digital rectal examination was performed at that time revealing 5 mm nodule in the right base.  A repeat PSA was performed on 04/24/2017 which remained elevated at 6.49.  The patient proceeded to transrectal ultrasound with 12 biopsies of the prostate on 08/18/2017.  The prostate volume measured 26.95.  Out of 12 core biopsies, 2 were positive.  The maximum Gleason score was 3 and this was seen in the+4 left mid and right apex.  Additionally there is Gleason 3+3 disease in the left mid lateral, left apex lateral and right apex lateral.  His most recent PSA on 11/10/2017 was 5.45.    The patient reviewed the biopsy results with his urologist and he has kindly been referred today for discussion of potential radiation treatment options.   PREVIOUS RADIATION THERAPY: No  PAST MEDICAL HISTORY:  Past Medical History:  Diagnosis Date  . Diabetes mellitus without complication ([place])   . Hypertension   . Obesity     . Prostate cancer ([place])   . Tobacco abuse       PAST SURGICAL HISTORY: Past Surgical History:  Procedure Laterality Date  . DENTAL SURGERY    . PROSTATE BIOPSY      FAMILY HISTORY:  Family History  Problem Relation Age of Onset  . Liver disease Mother   . Cancer Father        prostate cancer  . Heart attack Father   . Sleep apnea Brother   . Cancer Maternal Grandfather        lung cancer/smoker    SOCIAL HISTORY:  Social History   Socioeconomic History  . Marital status: Married    Spouse name: Vicky  . Number of children: 0  . Years of education: college  . Highest education level: Not on file  Occupational History    Comment: works second shift  Social Needs  . Financial resource strain: Not on file  . Food insecurity:    Worry: Not on file    Inability: Not on file  . Transportation needs:    Medical: Not on file    Non-medical: Not on file  Tobacco Use  . Smoking status: Former Smoker    Years: 2.00    Types: Cigars    Last attempt to quit: 11/18/2017    Years since quitting: 0.0  . Smokeless tobacco: Never Used  Substance and Sexual Activity  . Alcohol use:  Yes    Alcohol/week: 1.8 oz    Types: 3 Shots of liquor per week    Comment: On Monday's after work  . Drug use: No  . Sexual activity: Yes  Lifestyle  . Physical activity:    Days per week: Not on file    Minutes per session: Not on file  . Stress: Not on file  Relationships  . Social connections:    Talks on phone: Not on file    Gets together: Not on file    Attends religious service: Not on file    Active member of club or organization: Not on file    Attends meetings of clubs or organizations: Not on file    Relationship status: Not on file  . Intimate partner violence:    Fear of current or ex partner: Not on file    Emotionally abused: Not on file    Physically abused: Not on file    Forced sexual activity: Not on file  Other Topics Concern  . Not on file  Social History  Narrative   Patient lives at home with his wife Jocelyn Lamer) and his father.   Patient works full time Works hours 3:00 pm until 2:oo am every night.   Education college.    Right handed.   Caffeine Two cups of coffee daily and one mountain dew daily.    ALLERGIES: Patient has no known allergies.  MEDICATIONS:  Current Outpatient Medications  Medication Sig Dispense Refill  . aspirin 325 MG tablet Take 81 mg by mouth daily as needed.     Marland Kitchen atorvastatin (LIPITOR) 40 MG tablet Take 1 tablet by mouth daily.    . Dapagliflozin-Metformin HCl ER (XIGDUO XR) 06-999 MG TB24 Take 1 tablet by mouth daily.    Marland Kitchen FLUoxetine (PROZAC) 10 MG tablet Take 10 mg by mouth daily.    Marland Kitchen lisinopril (PRINIVIL,ZESTRIL) 40 MG tablet Take 40 mg by mouth daily.    . Misc Natural Products (BLACK CHERRY CONCENTRATE PO) Take by mouth daily.    Marland Kitchen omeprazole (PRILOSEC) 20 MG capsule Take 1 capsule by mouth daily.    . colchicine 0.6 MG tablet Take 0.6 mg by mouth daily.    . nitroGLYCERIN (NITROSTAT) 0.4 MG SL tablet Place 1 tablet (0.4 mg total) under the tongue every 5 (five) minutes as needed for chest pain. (Patient not taking: Reported on 12/13/2017) 25 tablet 3   No current facility-administered medications for this encounter.     REVIEW OF SYSTEMS:  On review of systems, the patient reports that he is doing well overall. He denies any chest pain, shortness of breath, cough, fevers, chills, night sweats, unintended weight changes. He denies any bowel disturbances, and denies abdominal pain, nausea or vomiting. He denies any new musculoskeletal or joint aches or pains. His IPSS was 5, indicating mild urinary symptoms. He is able to complete sexual activity with less then half attempts. A complete review of systems is obtained and is otherwise negative.    PHYSICAL EXAM:  Wt Readings from Last 3 Encounters:  12/13/17 221 lb 6.4 oz (100.4 kg)  05/08/17 211 lb (95.7 kg)  12/12/16 227 lb (103 kg)   Temp Readings from  Last 3 Encounters:  12/13/17 98.3 F (36.8 C) (Oral)  07/17/13 98.1 F (36.7 C) (Oral)   BP Readings from Last 3 Encounters:  12/13/17 (!) 104/51  05/08/17 98/70  12/12/16 127/78   Pulse Readings from Last 3 Encounters:  12/13/17 64  05/08/17 75  12/12/16 64   Pain Assessment Pain Score: 0-No pain/10  In general this is a well appearing 52 y.o. male in no acute distress. He is alert and oriented x4 and appropriate throughout the examination. HEENT reveals that the patient is normocephalic, atraumatic. EOMs are intact. PERRLA. Skin is intact without any evidence of gross lesions. Cardiovascular exam reveals a regular rate and rhythm, no clicks rubs or murmurs are auscultated. Chest is clear to auscultation bilaterally. Lymphatic assessment is performed and does not reveal any adenopathy in the cervical, supraclavicular, axillary, or inguinal chains. Abdomen has active bowel sounds in all quadrants and is intact. The abdomen is soft, non tender, non distended. Lower extremities are negative for pretibial pitting edema, deep calf tenderness, cyanosis or clubbing.   KPS = 100  100 - Normal; no complaints; no evidence of disease. 90   - Able to carry on normal activity; minor signs or symptoms of disease. 80   - Normal activity with effort; some signs or symptoms of disease. 44   - Cares for self; unable to carry on normal activity or to do active work. 60   - Requires occasional assistance, but is able to care for most of his personal needs. 50   - Requires considerable assistance and frequent medical care. 67   - Disabled; requires special care and assistance. 57   - Severely disabled; hospital admission is indicated although death not imminent. 34   - Very sick; hospital admission necessary; active supportive treatment necessary. 10   - Moribund; fatal processes progressing rapidly. 0     - Dead  Karnofsky DA, Abelmann Hamilton, Craver LS and Burchenal [facility] [phone]) The use of the nitrogen  mustards in the palliative treatment of carcinoma: with particular reference to bronchogenic carcinoma Cancer 1 634-56  LABORATORY DATA:  Lab Results  Component Value Date   WBC 9.5 07/17/2013   HGB 15.8 07/17/2013   HCT 45.5 07/17/2013   MCV 92.1 07/17/2013   PLT 179 07/17/2013   Lab Results  Component Value Date   NA 129 (L) 07/17/2013   K 4.2 07/17/2013   CL 96 07/17/2013   CO2 22 07/17/2013   Lab Results  Component Value Date   ALT 39 08/11/2008   AST 31 08/11/2008   ALKPHOS 50 08/11/2008   BILITOT 0.6 08/11/2008     RADIOGRAPHY: No results found.    IMPRESSION/PLAN: 1. 52 y.o. 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 We discussed the patient's workup and outlines the nature of prostate cancer in this setting. The patient's T stage, Gleason's score, and PSA put him into the intermediate risk group. Accordingly, he is eligible for a variety of potential treatment options including brachytherapy, 5.5 - 8 weeks of external radiation or 5 weeks of external radiation followed by a brachytherapy boost. We discussed the available radiation techniques, and focused on the details and logistics and delivery. We discussed and outlined the risks, benefits, short and long-term effects associated with radiotherapy and compared and contrasted these with prostatectomy. We discussed the role of SpaceOAR in reducing the rectal toxicity associated with radiotherapy.   At the conclusion of our conversation, the patient is undecided regarding his treatment preference but appears to be leaning towards brachytherapy.  We will see her findings with Dr. Louis Meckel and will plan to follow-up with the patient by phone within the next 2-3 weeks to ascertain his treatment decision and will proceed with scheduling accordingly  at that time.    Nicholos Johns, PA-C    Tyler Pita, MD  [facility] Direct Dial: [phone]   Fax: [phone] [email or web address]  Skype  [email or web address]   This document serves as a record of services personally performed by Tyler Pita, MD and Freeman Caldron, PA-C. It was created on their behalf by Margit Banda, a trained medical scribe. The creation of this record is based on the scribe's personal observations and the provider's statements to them. This document has been checked and approved by the attending provider.

## 2017-12-20 ENCOUNTER — Other Ambulatory Visit: Payer: Self-pay | Admitting: Urology

## 2017-12-22 ENCOUNTER — Encounter: Payer: Self-pay | Admitting: Urology

## 2017-12-22 NOTE — Progress Notes (Signed)
Patient has elected to proceed with RALP and is scheduled for 01/19/18 with Dr. Louis Meckel.

## 2017-12-26 ENCOUNTER — Telehealth: Payer: Self-pay | Admitting: Medical Oncology

## 2017-12-26 NOTE — Telephone Encounter (Signed)
Left message with patient to introduce myself as the prostate nurse navigator and my role. I was unable to meet him the day he consulted with Dr. Tammi Klippel. He has chosen robotic prostatectomy as treatment. He is scheduled for surgery 01/19/18. I asked him to call if I can ever be of assistance.

## 2018-01-05 ENCOUNTER — Ambulatory Visit: Payer: Managed Care, Other (non HMO) | Admitting: Nurse Practitioner

## 2018-01-15 ENCOUNTER — Other Ambulatory Visit: Payer: Self-pay

## 2018-01-15 ENCOUNTER — Observation Stay (HOSPITAL_COMMUNITY)
Admission: EM | Admit: 2018-01-15 | Discharge: 2018-01-16 | Disposition: A | Discharge status: Home / Self Care | Payer: 59 | Attending: Internal Medicine | Admitting: Internal Medicine

## 2018-01-15 ENCOUNTER — Encounter (HOSPITAL_COMMUNITY): Payer: Self-pay | Admitting: Family Medicine

## 2018-01-15 ENCOUNTER — Emergency Department (HOSPITAL_COMMUNITY): Payer: 59

## 2018-01-15 DIAGNOSIS — G4733 Obstructive sleep apnea (adult) (pediatric): Secondary | ICD-10-CM | POA: Insufficient documentation

## 2018-01-15 DIAGNOSIS — E669 Obesity, unspecified: Secondary | ICD-10-CM | POA: Diagnosis not present

## 2018-01-15 DIAGNOSIS — F17290 Nicotine dependence, other tobacco product, uncomplicated: Secondary | ICD-10-CM | POA: Diagnosis not present

## 2018-01-15 DIAGNOSIS — R0789 Other chest pain: Principal | ICD-10-CM | POA: Insufficient documentation

## 2018-01-15 DIAGNOSIS — F329 Major depressive disorder, single episode, unspecified: Secondary | ICD-10-CM | POA: Diagnosis not present

## 2018-01-15 DIAGNOSIS — R079 Chest pain, unspecified: Secondary | ICD-10-CM | POA: Diagnosis present

## 2018-01-15 DIAGNOSIS — Z79899 Other long term (current) drug therapy: Secondary | ICD-10-CM | POA: Diagnosis not present

## 2018-01-15 DIAGNOSIS — E1169 Type 2 diabetes mellitus with other specified complication: Secondary | ICD-10-CM | POA: Diagnosis not present

## 2018-01-15 DIAGNOSIS — E785 Hyperlipidemia, unspecified: Secondary | ICD-10-CM | POA: Diagnosis not present

## 2018-01-15 DIAGNOSIS — Z720 Tobacco use: Secondary | ICD-10-CM | POA: Diagnosis present

## 2018-01-15 DIAGNOSIS — E119 Type 2 diabetes mellitus without complications: Secondary | ICD-10-CM

## 2018-01-15 DIAGNOSIS — Z8546 Personal history of malignant neoplasm of prostate: Secondary | ICD-10-CM | POA: Insufficient documentation

## 2018-01-15 DIAGNOSIS — K219 Gastro-esophageal reflux disease without esophagitis: Secondary | ICD-10-CM | POA: Insufficient documentation

## 2018-01-15 DIAGNOSIS — I10 Essential (primary) hypertension: Secondary | ICD-10-CM | POA: Diagnosis present

## 2018-01-15 DIAGNOSIS — R0602 Shortness of breath: Secondary | ICD-10-CM | POA: Insufficient documentation

## 2018-01-15 DIAGNOSIS — Z791 Long term (current) use of non-steroidal anti-inflammatories (NSAID): Secondary | ICD-10-CM | POA: Insufficient documentation

## 2018-01-15 HISTORY — DX: Dependence on other enabling machines and devices: Z99.89

## 2018-01-15 HISTORY — DX: Obstructive sleep apnea (adult) (pediatric): G47.33

## 2018-01-15 HISTORY — DX: Gastro-esophageal reflux disease without esophagitis: K21.9

## 2018-01-15 HISTORY — DX: Hyperlipidemia, unspecified: E78.5

## 2018-01-15 LAB — I-STAT TROPONIN, ED: Troponin i, poc: 0 ng/mL (ref 0.00–0.08)

## 2018-01-15 LAB — BASIC METABOLIC PANEL
Anion gap: 10 (ref 5–15)
BUN: 17 mg/dL (ref 6–20)
CHLORIDE: 103 mmol/L (ref 101–111)
CO2: 24 mmol/L (ref 22–32)
CREATININE: 1.02 mg/dL (ref 0.61–1.24)
Calcium: 9.7 mg/dL (ref 8.9–10.3)
GFR calc Af Amer: >60 mL/min (ref >60)
GFR calc non Af Amer: >60 mL/min (ref >60)
Glucose, Bld: 182 mg/dL — ABNORMAL HIGH (ref 65–99)
Potassium: 4.2 mmol/L (ref 3.5–5.1)
Sodium: 137 mmol/L (ref 135–145)

## 2018-01-15 LAB — HEPATIC FUNCTION PANEL
ALK PHOS: 58 U/L (ref 38–126)
ALT: 29 U/L (ref 17–63)
AST: 26 U/L (ref 15–41)
Albumin: 3.9 g/dL (ref 3.5–5.0)
BILIRUBIN DIRECT: 0.1 mg/dL (ref 0.1–0.5)
BILIRUBIN INDIRECT: 0.1 mg/dL — AB (ref 0.3–0.9)
Total Bilirubin: 0.2 mg/dL — ABNORMAL LOW (ref 0.3–1.2)
Total Protein: 7.7 g/dL (ref 6.5–8.1)

## 2018-01-15 LAB — CBC
HCT: 46.3 % (ref 39.0–52.0)
Hemoglobin: 15.8 g/dL (ref 13.0–17.0)
MCH: 31.3 pg (ref 26.0–34.0)
MCHC: 34.1 g/dL (ref 30.0–36.0)
MCV: 91.7 fL (ref 78.0–100.0)
PLATELETS: 249 10*3/uL (ref 150–400)
RBC: 5.05 MIL/uL (ref 4.22–5.81)
RDW: 13.6 % (ref 11.5–15.5)
WBC: 9.5 10*3/uL (ref 4.0–10.5)

## 2018-01-15 LAB — TROPONIN I: Troponin I: <0.03 ng/mL (ref <0.03)

## 2018-01-15 LAB — LIPASE, BLOOD: Lipase: 43 U/L (ref 11–51)

## 2018-01-15 MED ORDER — ASPIRIN 81 MG PO CHEW
324.0000 mg | CHEWABLE_TABLET | Freq: Once | ORAL | Status: AC
  Administered 2018-01-15: 324 mg via ORAL
  Filled 2018-01-15: qty 4

## 2018-01-15 NOTE — ED Provider Notes (Signed)
[facility] DEPT Provider Note   CSN: [id number] Arrival date & time: 01/15/18  1314     History   Chief Complaint Chief Complaint  Patient presents with  . Chest Pain    HPI Shawn Hicks is a 52 y.o. male.  HPI    52 yo M with DM, HTN, HLD here with chest pain.  Patient states that over the last week, he has had intermittent episodes of lightheadedness and chest pain.  His symptoms started approximately a week ago.  He was walking at work when he experienced acute onset of a shortness of breath sensation with a lightheadedness and sensation that he could not catch his breath.  He rested and the symptoms improved.  Over the last week, has had recurrent episodes similar to this.  They are occasionally associated with exertion but also occur at rest.  Over the last several days, he is also notes a dull, aching, intermittent chest pressure.  This pressure also comes and goes intermittently and is not necessarily associated with exertion.  He also seems to get worse when he is lying flat and also occurs at rest.  Denies any nausea.  No vomiting.  Of note, the patient is currently scheduled for a prostate surgery on Friday and he does endorse increased anxiety related to this.  Denies any history of panic attacks.  No lower extremity swelling.  He has a history of hypertension, diabetes, as well as diabetes.  He has not ever had a stress test.  Past Medical History:  Diagnosis Date  . Diabetes mellitus without complication ([place])   . GERD (gastroesophageal reflux disease)   . Hyperlipidemia   . Hypertension   . Obesity   . Prostate cancer ([place])   . Tobacco abuse     Patient Active Problem List   Diagnosis Date Noted  . Malignant neoplasm of prostate ([place]) 12/12/2017  . 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 [place] 11/10/2014  . Severe obesity (BMI >= 40) ([place]) 11/10/2014  . Excessive  daytime sleepiness 11/10/2014  . Sleep disorder, circadian, shift work type 11/10/2014  . Hypertension   . Chest pain 08/27/2014  . Tobacco abuse 08/27/2014  . Type 2 diabetes mellitus ([place]) 08/27/2014  . Essential hypertension 08/27/2014  . Palpitations 07/31/2013  . Obesity 07/31/2013  . Hyposmolality and/or hyponatremia 07/31/2013    Past Surgical History:  Procedure Laterality Date  . DENTAL SURGERY    . PROSTATE BIOPSY          Home Medications    Prior to Admission medications   Medication Sig Start Date End Date Taking? Authorizing Provider  atorvastatin (LIPITOR) 40 MG tablet Take 40 mg by mouth every evening.  08/04/14  Yes [provider]  colchicine 0.6 MG tablet Take 0.6 mg by mouth daily as needed (gout).    Yes [provider]  Dapagliflozin-Metformin HCl ER (XIGDUO XR) 06-999 MG TB24 Take 1 tablet by mouth every evening.    Yes [provider]  diclofenac sodium (VOLTAREN) 1 % GEL Apply 1 application topically 2 (two) times daily as needed (pain).   Yes [provider]  FLUoxetine (PROZAC) 10 MG tablet Take 10 mg by mouth daily.   Yes [provider]  lisinopril (PRINIVIL,ZESTRIL) 40 MG tablet Take 20 mg by mouth every evening.    Yes [provider]  Misc Natural Products (BLACK CHERRY CONCENTRATE PO) Take 1 tablet by  mouth every evening.    Yes [provider]  neomycin-bacitracin-polymyxin (NEOSPORIN) ointment Apply 1 application topically as needed for wound care.   Yes [provider]  nitroGLYCERIN (NITROSTAT) 0.4 MG SL tablet Place 1 tablet (0.4 mg total) under the tongue every 5 (five) minutes as needed for chest pain. 08/27/14  Yes Brett Canales, PA-C  omeprazole (PRILOSEC) 20 MG capsule Take 20 mg by mouth every evening.  08/04/14  Yes [provider]  sitaGLIPtin (JANUVIA) 100 MG tablet Take 100 mg by mouth daily.   Yes [provider]  TURMERIC PO Take 1 capsule by  mouth every evening.   Yes [provider]  ibuprofen (ADVIL,MOTRIN) 200 MG tablet Take 200 mg by mouth daily as needed for headache or moderate pain.    [provider]    Family History Family History  Problem Relation Age of Onset  . Liver disease Mother   . Cancer Father        prostate cancer  . Heart attack Father   . Sleep apnea Brother   . Cancer Maternal Grandfather        lung cancer/smoker    Social History Social History   Tobacco Use  . Smoking status: Former Smoker    Years: 2.00    Types: Cigars    Last attempt to quit: 11/18/2017    Years since quitting: 0.1  . Smokeless tobacco: Never Used  Substance Use Topics  . Alcohol use: Yes    Alcohol/week: 1.8 oz    Types: 3 Shots of liquor per week    Comment: 2-3 times a month.   . Drug use: No     Allergies   Patient has no known allergies.   Review of Systems Review of Systems  Constitutional: Positive for fatigue.  Respiratory: Positive for chest tightness.   Cardiovascular: Positive for chest pain.  Neurological: Positive for weakness and light-headedness.  All other systems reviewed and are negative.    Physical Exam Updated Vital Signs BP 127/72   Pulse (!) 59   Temp 97.7 F (36.5 C) (Oral)   Resp 12   Ht 5\' 9"  (1.753 m)   Wt 102.1 kg (225 lb)   SpO2 99%   BMI 33.23 kg/m   Physical Exam  Constitutional: He is oriented to person, place, and time. He appears well-developed and well-nourished. No distress.  HENT:  Head: Normocephalic and atraumatic.  Eyes: Pupils are equal, round, and reactive to light. Conjunctivae are normal.  Neck: Neck supple.  Cardiovascular: Normal rate, regular rhythm and normal heart sounds. Exam reveals no friction rub.  No murmur heard. Pulmonary/Chest: Effort normal and breath sounds normal. No respiratory distress. He has no wheezes. He has no rales.  Abdominal: He exhibits no distension.  Musculoskeletal: He exhibits no edema.    Neurological: He is alert and oriented to person, place, and time. He exhibits normal muscle tone.  Skin: Skin is warm. Capillary refill takes less than 2 seconds.  Psychiatric: He has a normal mood and affect.  Nursing note and vitals reviewed.    ED Treatments / Results  Labs (all labs ordered are listed, but only abnormal results are displayed) Labs Reviewed  BASIC METABOLIC PANEL - Abnormal; Notable for the following components:      Result Value   Glucose, Bld 182 (*)    All other components within normal limits  HEPATIC FUNCTION PANEL - Abnormal; Notable for the following components:   Total Bilirubin 0.2 (*)  Indirect Bilirubin 0.1 (*)    All other components within normal limits  CBC  TROPONIN I  LIPASE, BLOOD  I-STAT TROPONIN, ED    EKG EKG Interpretation  Date/Time:  Monday January 15 2018 13:28:11 EDT Ventricular Rate:  61 PR Interval:    QRS Duration: 87 QT Interval:  388 QTC Calculation: 391 R Axis:   32 Text Interpretation:  Sinus rhythm No significant change since last tracing Confirmed by Duffy Bruce [phone]) on 01/15/2018 11:43:17 PM   Radiology Dg Chest 2 View  Result Date: 01/15/2018 CLINICAL DATA:  Worsening chest pain over the past few days. EXAM: CHEST - 2 VIEW COMPARISON:  Chest x-ray dated August 11, 2008. FINDINGS: The heart size and mediastinal contours are within normal limits. Normal pulmonary vascularity. No focal consolidation, pleural effusion, or pneumothorax. No acute osseous abnormality. IMPRESSION: No active cardiopulmonary disease. Electronically Signed   By: Titus Dubin M.D.   On: 01/15/2018 14:35    Procedures Procedures (including critical care time)  Medications Ordered in ED Medications  aspirin chewable tablet 324 mg (324 mg Oral Given 01/15/18 2224)     Initial Impression / Assessment and Plan / ED Course  I have reviewed the triage vital signs and the nursing notes.  Pertinent labs & imaging results that were  available during my care of the patient were reviewed by me and considered in my medical decision making (see chart for details).     52 yo F here with intermittent chest pain, exertional at times. Associated w/ lightheadedness, some dyspnea. EKG non-ischemic. Trop neg. HEART score is 5 based on risk factors, EKG. Will admit for high risk CP eval. LFTs normal, abd exam benign, doubt chole.  Final Clinical Impressions(s) / ED Diagnoses   Final diagnoses:  Atypical chest pain    ED Discharge Orders    None       Duffy Bruce, MD 01/15/18 2343

## 2018-01-15 NOTE — ED Triage Notes (Signed)
Patient reports his PCP decreased his Lisinopril from 40mg  to 20mg  about 2 weeks ago. He has a follow up in 4 months. A1c was around 7.8 at this visit and another diabetic medication was added. Also, reports he has not took it his BP medication two days due to not feeling well and his BP 101/35 at home.

## 2018-01-15 NOTE — ED Notes (Signed)
Pt is alert and oriented x 4 and is  Verbally responsive. Pt reports that pain started last week weds, but has gotten worse since last night. Pt is escorted with his spouse and ratea 5/10 heavy pain. Pt denies n/v at this time  And did reports some mild nausea this am

## 2018-01-15 NOTE — ED Triage Notes (Signed)
Patient reports he developed mid-sternal and left sided pressure a few days ago but got worse last night after eating. Patient reports he was watching television when the pain became worse. Associated symptoms of dizziness, lightheadedness, nausea, and generalized weakness. Patient's respirations are even, regular, and unlabored. Skin is warm and dry. Patient reports he has been anxious about having his prostate removed on Friday.

## 2018-01-16 ENCOUNTER — Encounter (HOSPITAL_COMMUNITY): Payer: Self-pay | Admitting: Cardiology

## 2018-01-16 ENCOUNTER — Ambulatory Visit (HOSPITAL_COMMUNITY)
Admit: 2018-01-16 | Discharge: 2018-01-16 | Disposition: A | Discharge status: Home / Self Care | Payer: 59 | Source: Ambulatory Visit | Attending: Cardiology | Admitting: Cardiology

## 2018-01-16 DIAGNOSIS — E785 Hyperlipidemia, unspecified: Secondary | ICD-10-CM | POA: Diagnosis not present

## 2018-01-16 DIAGNOSIS — Z6831 Body mass index (BMI) 31.0-31.9, adult: Secondary | ICD-10-CM | POA: Insufficient documentation

## 2018-01-16 DIAGNOSIS — R9439 Abnormal result of other cardiovascular function study: Secondary | ICD-10-CM | POA: Diagnosis not present

## 2018-01-16 DIAGNOSIS — E669 Obesity, unspecified: Secondary | ICD-10-CM | POA: Insufficient documentation

## 2018-01-16 DIAGNOSIS — Z8546 Personal history of malignant neoplasm of prostate: Secondary | ICD-10-CM | POA: Diagnosis not present

## 2018-01-16 DIAGNOSIS — R079 Chest pain, unspecified: Secondary | ICD-10-CM | POA: Insufficient documentation

## 2018-01-16 DIAGNOSIS — E1169 Type 2 diabetes mellitus with other specified complication: Secondary | ICD-10-CM | POA: Diagnosis not present

## 2018-01-16 DIAGNOSIS — Z79899 Other long term (current) drug therapy: Secondary | ICD-10-CM | POA: Diagnosis not present

## 2018-01-16 DIAGNOSIS — I10 Essential (primary) hypertension: Secondary | ICD-10-CM | POA: Diagnosis not present

## 2018-01-16 DIAGNOSIS — R0789 Other chest pain: Secondary | ICD-10-CM | POA: Diagnosis present

## 2018-01-16 DIAGNOSIS — F329 Major depressive disorder, single episode, unspecified: Secondary | ICD-10-CM | POA: Diagnosis not present

## 2018-01-16 DIAGNOSIS — G4733 Obstructive sleep apnea (adult) (pediatric): Secondary | ICD-10-CM | POA: Diagnosis not present

## 2018-01-16 DIAGNOSIS — E119 Type 2 diabetes mellitus without complications: Secondary | ICD-10-CM

## 2018-01-16 DIAGNOSIS — R0602 Shortness of breath: Secondary | ICD-10-CM | POA: Diagnosis not present

## 2018-01-16 DIAGNOSIS — Z791 Long term (current) use of non-steroidal anti-inflammatories (NSAID): Secondary | ICD-10-CM | POA: Diagnosis not present

## 2018-01-16 DIAGNOSIS — K219 Gastro-esophageal reflux disease without esophagitis: Secondary | ICD-10-CM | POA: Diagnosis not present

## 2018-01-16 DIAGNOSIS — F17290 Nicotine dependence, other tobacco product, uncomplicated: Secondary | ICD-10-CM | POA: Diagnosis not present

## 2018-01-16 DIAGNOSIS — Z720 Tobacco use: Secondary | ICD-10-CM

## 2018-01-16 LAB — NM MYOCAR MULTI W/SPECT W/WALL MOTION / EF
CHL CUP MPHR: 167 {beats}/min
Estimated workload: 7.7 METS
Exercise duration (min): 6 min
Exercise duration (sec): 49 s
Peak HR: 153 {beats}/min
Percent HR: 91 %
Rest HR: 58 {beats}/min

## 2018-01-16 LAB — HIV ANTIBODY (ROUTINE TESTING W REFLEX): HIV SCREEN 4TH GENERATION: NONREACTIVE

## 2018-01-16 LAB — CBG MONITORING, ED
GLUCOSE-CAPILLARY: 150 mg/dL — AB (ref 65–99)
GLUCOSE-CAPILLARY: 164 mg/dL — AB (ref 65–99)

## 2018-01-16 LAB — TROPONIN I: Troponin I: <0.03 ng/mL (ref <0.03)

## 2018-01-16 MED ORDER — INSULIN ASPART 100 UNIT/ML ~~LOC~~ SOLN
0.0000 [IU] | SUBCUTANEOUS | Status: DC
  Filled 2018-01-16: qty 1

## 2018-01-16 MED ORDER — TECHNETIUM TC 99M TETROFOSMIN IV KIT
10.0000 | PACK | Freq: Once | INTRAVENOUS | Status: AC | PRN
  Administered 2018-01-16: 10 via INTRAVENOUS

## 2018-01-16 MED ORDER — COLCHICINE 0.6 MG PO TABS
0.6000 mg | ORAL_TABLET | Freq: Every day | ORAL | Status: DC | PRN
  Filled 2018-01-16: qty 1

## 2018-01-16 MED ORDER — ACETAMINOPHEN 325 MG PO TABS
650.0000 mg | ORAL_TABLET | ORAL | Status: DC | PRN
Start: 2018-01-16 — End: 2018-01-16

## 2018-01-16 MED ORDER — PANTOPRAZOLE SODIUM 40 MG PO TBEC: 40.0000 mg | DELAYED_RELEASE_TABLET | Freq: Every day | ORAL | Status: DC

## 2018-01-16 MED ORDER — ENOXAPARIN SODIUM 40 MG/0.4ML ~~LOC~~ SOLN
40.0000 mg | SUBCUTANEOUS | Status: DC
  Filled 2018-01-16: qty 0.4

## 2018-01-16 MED ORDER — ONDANSETRON HCL 4 MG/2ML IJ SOLN: 4.0000 mg | Freq: Four times a day (QID) | INTRAMUSCULAR | Status: DC | PRN

## 2018-01-16 MED ORDER — FLUOXETINE HCL 10 MG PO CAPS
10.0000 mg | ORAL_CAPSULE | Freq: Every day | ORAL | Status: DC
  Filled 2018-01-16: qty 1

## 2018-01-16 MED ORDER — TECHNETIUM TC 99M TETROFOSMIN IV KIT
30.0000 | PACK | Freq: Once | INTRAVENOUS | Status: AC | PRN
  Administered 2018-01-16: 30 via INTRAVENOUS

## 2018-01-16 MED ORDER — ATORVASTATIN CALCIUM 20 MG PO TABS: 40.0000 mg | ORAL_TABLET | Freq: Every evening | ORAL | Status: DC

## 2018-01-16 MED ORDER — LISINOPRIL 20 MG PO TABS: 20.0000 mg | ORAL_TABLET | Freq: Every evening | ORAL | Status: DC

## 2018-01-16 NOTE — Consult Note (Deleted)
Reason for Consult:laceration of tongue Referring Physician: er  Shawn Hicks is an 52 y.o. male.  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 It was closed by Er but still with some oozing. He may still be seizing and work up continues. He is intubated with a subglottic suction. He has not other injuries known.   Past Medical History:  Diagnosis Date  . Diabetes mellitus without complication ([place])   . GERD (gastroesophageal reflux disease)   . Hyperlipidemia   . Hypertension   . Obesity   . OSA on CPAP   . Prostate cancer ([place])   . Tobacco abuse     Past Surgical History:  Procedure Laterality Date  . DENTAL SURGERY    . PROSTATE BIOPSY      Family History  Problem Relation Age of Onset  . Liver disease Mother   . Cancer Father        prostate cancer  . Heart attack Father   . Sleep apnea Brother   . Cancer Maternal Grandfather        lung cancer/smoker    Social History:  reports that he quit smoking about 8 weeks ago. His smoking use included cigars. He quit after 2.00 years of use. He has never used smokeless tobacco. He reports that he drinks about 1.8 oz of alcohol per week. He reports that he does not use drugs.  Allergies: No Known Allergies  Medications: I have reviewed the patient's current medications.  Results for orders placed or performed during the hospital encounter of 01/15/18 (from the past 48 hour(s))  Basic metabolic panel     Status: Abnormal   Collection Time: 01/15/18  1:44 PM  Result Value Ref Range   Sodium 137 135 - 145 mmol/L   Potassium 4.2 3.5 - 5.1 mmol/L   Chloride 103 101 - 111 mmol/L   CO2 24 22 - 32 mmol/L   Glucose, Bld 182 (H) 65 - 99 mg/dL   BUN 17 6 - 20 mg/dL   Creatinine, Ser 1.02 0.61 - 1.24 mg/dL   Calcium 9.7 8.9 - 10.3 mg/dL   GFR calc non Af Amer >60 >60 mL/min   GFR calc Af Amer >60 >60 mL/min    Comment: (NOTE) The eGFR has been calculated using the CKD EPI equation. This  calculation has not been validated in all clinical situations. eGFR's persistently <60 mL/min signify possible Chronic Kidney Disease.    Anion gap 10 5 - 15    Comment: Performed at [facility], [place] [address] [place], [place] [address]  CBC     Status: None   Collection Time: 01/15/18  1:44 PM  Result Value Ref Range   WBC 9.5 4.0 - 10.5 K/uL   RBC 5.05 4.22 - 5.81 MIL/uL   Hemoglobin 15.8 13.0 - 17.0 g/dL   HCT 46.3 39.0 - 52.0 %   MCV 91.7 78.0 - 100.0 fL   MCH 31.3 26.0 - 34.0 pg   MCHC 34.1 30.0 - 36.0 g/dL   RDW 13.6 11.5 - 15.5 %   Platelets 249 150 - 400 K/uL    Comment: Performed at [facility], [place] [address], [place], [place] [address]  I-stat troponin, ED     Status: None   Collection Time: 01/15/18  1:49 PM  Result Value Ref Range   Troponin i, poc 0.00 0.00 - 0.08 ng/mL   Comment 3  Comment: Due to the release kinetics of cTnI, a negative result within the first hours of the onset of symptoms does not rule out myocardial infarction with certainty. If myocardial infarction is still suspected, repeat the test at appropriate intervals.   Troponin I     Status: None   Collection Time: 01/15/18 10:36 PM  Result Value Ref Range   Troponin I <0.03 <0.03 ng/mL    Comment: Performed at [facility], [place] [address], [place], [place] [address]  Hepatic function panel     Status: Abnormal   Collection Time: 01/15/18 10:36 PM  Result Value Ref Range   Total Protein 7.7 6.5 - 8.1 g/dL   Albumin 3.9 3.5 - 5.0 g/dL   AST 26 15 - 41 U/L   ALT 29 17 - 63 U/L   Alkaline Phosphatase 58 38 - 126 U/L   Total Bilirubin 0.2 (L) 0.3 - 1.2 mg/dL   Bilirubin, Direct 0.1 0.1 - 0.5 mg/dL   Indirect Bilirubin 0.1 (L) 0.3 - 0.9 mg/dL    Comment: Performed at [facility], [place] [address] [place], [place] [address]  Lipase, blood     Status: None   Collection Time: 01/15/18 10:36 PM  Result Value  Ref Range   Lipase 43 11 - 51 U/L    Comment: Performed at [facility], [place] [address] [place], [place] [address]  CBG monitoring, ED     Status: Abnormal   Collection Time: 01/16/18  6:10 AM  Result Value Ref Range   Glucose-Capillary 150 (H) 65 - 99 mg/dL  Troponin I-serum (0, 3, 6 hours)     Status: None   Collection Time: 01/16/18  6:11 AM  Result Value Ref Range   Troponin I <0.03 <0.03 ng/mL    Comment: Performed at [facility], [place] [address], [place], [place] [address]  CBG monitoring, ED     Status: Abnormal   Collection Time: 01/16/18  7:46 AM  Result Value Ref Range   Glucose-Capillary 164 (H) 65 - 99 mg/dL  Troponin I-serum (0, 3, 6 hours)     Status: None   Collection Time: 01/16/18  8:38 AM  Result Value Ref Range   Troponin I <0.03 <0.03 ng/mL    Comment: Performed at [facility], [place] [address], [place], [place] [address]    Dg Chest 2 View  Result Date: 01/15/2018 CLINICAL DATA:  Worsening chest pain over the past few days. EXAM: CHEST - 2 VIEW COMPARISON:  Chest x-ray dated August 11, 2008. FINDINGS: The heart size and mediastinal contours are within normal limits. Normal pulmonary vascularity. No focal consolidation, pleural effusion, or pneumothorax. No acute osseous abnormality. IMPRESSION: No active cardiopulmonary disease. Electronically Signed   By: Titus Dubin M.D.   On: 01/15/2018 14:35    ROS Blood pressure 139/72, pulse (!) 55, temperature 97.7 F (36.5 C), temperature source Oral, resp. rate 19, height '5\' 9"'  (1.753 m), weight 102.1 kg (225 lb), SpO2 90 %. Physical Exam  Constitutional: He appears well-developed.  HENT:  Head: Normocephalic.  Nose: Nose normal.  ETT tube in place. Respiratory took the holder off and the tongue examined. The primary laceration is closed. The lateral aspects have small lacerations but nothing gapping. The tongue has some eccyhmosis but not excessively  swollen. No hematoma. Bleeding appears to have stopped.   Neck: Neck supple.    Assessment/Plan: Tongue laceration- it appears the tongue is closed appropriately anteriorly  and the remaining lacerations are not necessary to close as they will heal without closure and are small. Additionally he would require anesthesia to close and not necessary to take the risk as he is still possibly seizing and work up continues. Once he is awake a saline rinse to the mouth BID would be helpful   Melissa Montane 01/16/2018, 10:24 AM

## 2018-01-16 NOTE — Progress Notes (Signed)
PROGRESS NOTE    Shawn Hicks  HFW:[id number] DOB: 1966-01-29 DOA: 01/15/2018 PCP: Crist Infante, MD   Brief Narrative:  Shawn Hicks is a 52 year old male who presents with one week of intermittent chest pain, with lightheadedness and dizziness. Past medical history is significant for diabetes mellitus type 2, hypertension, hyperlipidemia, obstructive sleep apnea, tobacco use and prostate cancer. He reports having chest pain for several years and was evaluated by cardiology last year on 01/06/2017. His chart was reviewed and at that time a 30 day event monitor was negative for any abnormalities, echocardiogram showed normal LV function of 60-65%. He has been having this current episode for the past 3 weeks and attributed it to anxiousness for his upcoming prostate surgery on Friday 5/3. Yesterday morning he awoke with chest pressure that got to a level of 9/10 and decided to come to the ED. The pressure is associated with shortness of breath, dizziness, palpitations and diaphoresis. He reports the chest pressure is intermittent and usually lasts about to an hour at a time, it can occur with exertion or at rest. It does not radiate. Denies panic attacks. He denies orthopnea, lower leg swelling. On admission temperature 97.7 F, blood pressure 140/81 mmHg pulse 63, respirations 20, oxygen saturation 100% on room air. Sodium 137, potassium 4.7, chloride 103, CO2 24, glucose 182, BUN 17, creatinine 1.02, calcium 9.7, anion gap 10, GFR >60, Troponin negative, chest x-ray unremarkable, EKG unremarkable.    Assessment & Plan:   Principal Problem:   Chest pain, rule out acute myocardial infarction Active Problems:   Tobacco abuse   Type 2 diabetes mellitus (HCC)   Essential hypertension   HLD (hyperlipidemia)   Chest pain -Symptoms are atypical, myocardial infarction ruled out by troponin and EKG, no evidence of ischemia. -Patient has multiple risk factors, including hypertension, hyperlipidemia,  diabetes, obesity, smoking. HEART score of 5. -Patient scheduled for prostate surgery this Friday 5/3, exercise stress scheduled today to assess for cardiovascular disease as cause of his symptoms and aid in risk stratification prior to surgery. -Cardiology on board and much appreciated. -If exercise stress test is normal he is clear for discharge from cardiology standpoint. -Continue to monitor.  Hypertension -Currently stable at 139/72 mmHg. -Continue Lisinopril.  Hyperlipidemia -Continue atorvastatin.   Diabetes Mellitus type 2 -Continue insulin sliding scale.  Tobacco use -Patient reports smoking cigarettes for 30 years, then switched to cigars which he stopped on 11/17/17. -Reports current vape use that contains nicotine.  OSA -CPAP at home, with consistent use.  GERD -Continue omeprazole.  DVT prophylaxis: Lovenox. Code Status: FULL. Family Communication: None at bedside. Disposition Plan: Home when clinically improved.   Consultants:   Cardiology.  Procedures:   None.  Antimicrobials:   None.   Subjective: Patient denies current chest pain, pressure or tightness. Denies shortness of breath, dizziness, cough, wheezing, orthopnea, lower leg swelling. Reports feeling much improved since admission.  Objective: Vitals:   01/16/18 0445 01/16/18 0544 01/16/18 0845 01/16/18 0900  BP:  117/88 (!) 140/99 (!) 136/98  Pulse: 67 (!) 59 (!) 58 (!) 56  Resp: 18 20 (!) 8 (!) 9  Temp:      TempSrc:      SpO2: 97% 100% 100% 100%  Weight:      Height:       No intake or output data in the 24 hours ending 01/16/18 1015 Filed Weights   01/15/18 1321  Weight: 102.1 kg (225 lb)    Examination:  General  exam: Appears calm and comfortable  Respiratory system: Clear to auscultation. Respiratory effort normal. Cardiovascular system: S1 & S2 heard, RRR. No murmurs, rubs, gallops or clicks. No pedal edema. Gastrointestinal system: Abdomen is nondistended, soft and  nontender. Normal bowel sounds heard. Central nervous system: Alert and oriented. No focal neurological deficits. Extremities: Moves all four extremities equally. Skin: No rashes, lesions or ulcers. Psychiatry: Judgement and insight appear normal. Mood & affect appropriate.    Data Reviewed: I have personally reviewed following labs and imaging studies  CBC: Recent Labs  Lab 01/15/18 1344  WBC 9.5  HGB 15.8  HCT 46.3  MCV 91.7  PLT 176   Basic Metabolic Panel: Recent Labs  Lab 01/15/18 1344  NA 137  K 4.2  CL 103  CO2 24  GLUCOSE 182*  BUN 17  CREATININE 1.02  CALCIUM 9.7   GFR: Estimated Creatinine Clearance: 98.7 mL/min (by C-G formula based on SCr of 1.02 mg/dL). Liver Function Tests: Recent Labs  Lab 01/15/18 2236  AST 26  ALT 29  ALKPHOS 58  BILITOT 0.2*  PROT 7.7  ALBUMIN 3.9   Recent Labs  Lab 01/15/18 2236  LIPASE 43   No results for input(s): AMMONIA in the last 168 hours. Coagulation Profile: No results for input(s): INR, PROTIME in the last 168 hours. Cardiac Enzymes: Recent Labs  Lab 01/15/18 2236 01/16/18 0611 01/16/18 0838  TROPONINI <0.03 <0.03 <0.03   BNP (last 3 results) No results for input(s): PROBNP in the last 8760 hours. HbA1C: No results for input(s): HGBA1C in the last 72 hours. CBG: Recent Labs  Lab 01/16/18 0610 01/16/18 0746  GLUCAP 150* 164*   Lipid Profile: No results for input(s): CHOL, HDL, LDLCALC, TRIG, CHOLHDL, LDLDIRECT in the last 72 hours. Thyroid Function Tests: No results for input(s): TSH, T4TOTAL, FREET4, T3FREE, THYROIDAB in the last 72 hours. Anemia Panel: No results for input(s): VITAMINB12, FOLATE, FERRITIN, TIBC, IRON, RETICCTPCT in the last 72 hours. Sepsis Labs: No results for input(s): PROCALCITON, LATICACIDVEN in the last 168 hours.  No results found for this or any previous visit (from the past 240 hour(s)).     Radiology Studies: Dg Chest 2 View  Result Date: 01/15/2018 CLINICAL  DATA:  Worsening chest pain over the past few days. EXAM: CHEST - 2 VIEW COMPARISON:  Chest x-ray dated August 11, 2008. FINDINGS: The heart size and mediastinal contours are within normal limits. Normal pulmonary vascularity. No focal consolidation, pleural effusion, or pneumothorax. No acute osseous abnormality. IMPRESSION: No active cardiopulmonary disease. Electronically Signed   By: Titus Dubin M.D.   On: 01/15/2018 14:35        Scheduled Meds: . atorvastatin  40 mg Oral QPM  . enoxaparin (LOVENOX) injection  40 mg Subcutaneous Q24H  . FLUoxetine  10 mg Oral Daily  . insulin aspart  0-9 Units Subcutaneous Q4H  . lisinopril  20 mg Oral QPM  . pantoprazole  40 mg Oral QHS   Continuous Infusions:   LOS: 0 days    Time spent: 25 minutes.    Eloy End, PA-S [facility]  If 7PM-7AM, please contact night-coverage [email or web address] Password [email or web address] 01/16/2018, 10:15 AM

## 2018-01-16 NOTE — Progress Notes (Signed)
Patient arrived via [facility]link for [facility] Myoview. A&O x4 and well appearing. Patient refusing medications here since he has them at home and is being D/C'd to home now. AVS discussed with patient and patient will be D/C'd home.

## 2018-01-16 NOTE — H&P (Signed)
History and Physical    Shawn Hicks IOX:[id number] DOB: [birth year] DOA: 01/15/2018  PCP: Crist Infante, MD  Patient coming from: Home  I have personally briefly reviewed patient's old medical records in [place]  Chief Complaint: Chest pain  HPI: Shawn Hicks is a 52 y.o. male with medical history significant of DM, HTN, HLD.  Patient presents to the ED with c/o chest pain.  Pain has been intermittently occurring over past week.  Located in L chest.  Pressure like symptoms.  Associated SOB.  Not really exertional, can occur at rest.  No N/V.  Currently scheduled for prostate surgery on Friday.   ED Course: Trop neg, EKG nl.   Review of Systems: As per HPI otherwise 10 point review of systems negative.   Past Medical History:  Diagnosis Date  . Diabetes mellitus without complication ([place])   . GERD (gastroesophageal reflux disease)   . Hyperlipidemia   . Hypertension   . Obesity   . Prostate cancer ([place])   . Tobacco abuse     Past Surgical History:  Procedure Laterality Date  . DENTAL SURGERY    . PROSTATE BIOPSY       reports that he quit smoking about 8 weeks ago. His smoking use included cigars. He quit after 2.00 years of use. He has never used smokeless tobacco. He reports that he drinks about 1.8 oz of alcohol per week. He reports that he does not use drugs.  No Known Allergies  Family History  Problem Relation Age of Onset  . Liver disease Mother   . Cancer Father        prostate cancer  . Heart attack Father   . Sleep apnea Brother   . Cancer Maternal Grandfather        lung cancer/smoker     Prior to Admission medications   Medication Sig Start Date End Date Taking? Authorizing Provider  atorvastatin (LIPITOR) 40 MG tablet Take 40 mg by mouth every evening.  08/04/14  Yes [provider]  colchicine 0.6 MG tablet Take 0.6 mg by mouth daily as needed (gout).    Yes [provider]  Dapagliflozin-Metformin HCl ER  (XIGDUO XR) 06-999 MG TB24 Take 1 tablet by mouth every evening.    Yes [provider]  diclofenac sodium (VOLTAREN) 1 % GEL Apply 1 application topically 2 (two) times daily as needed (pain).   Yes [provider]  FLUoxetine (PROZAC) 10 MG tablet Take 10 mg by mouth daily.   Yes [provider]  lisinopril (PRINIVIL,ZESTRIL) 40 MG tablet Take 20 mg by mouth every evening.    Yes [provider]  Misc Natural Products (BLACK CHERRY CONCENTRATE PO) Take 1 tablet by mouth every evening.    Yes [provider]  neomycin-bacitracin-polymyxin (NEOSPORIN) ointment Apply 1 application topically as needed for wound care.   Yes [provider]  nitroGLYCERIN (NITROSTAT) 0.4 MG SL tablet Place 1 tablet (0.4 mg total) under the tongue every 5 (five) minutes as needed for chest pain. 08/27/14  Yes Brett Canales, PA-C  omeprazole (PRILOSEC) 20 MG capsule Take 20 mg by mouth every evening.  08/04/14  Yes [provider]  sitaGLIPtin (JANUVIA) 100 MG tablet Take 100 mg by mouth daily.   Yes [provider]  TURMERIC PO Take 1 capsule by mouth every evening.   Yes [provider]  ibuprofen (ADVIL,MOTRIN) 200 MG tablet Take 200 mg by mouth daily as needed for headache  or moderate pain.    [provider]    Physical Exam: Vitals:   01/16/18 0300 01/16/18 0337 01/16/18 0445 01/16/18 0544  BP:  120/67  117/88  Pulse: 63 (!) 57 67 (!) 59  Resp: 19 17 18 20   Temp:      TempSrc:      SpO2: 95% 98% 97% 100%  Weight:      Height:        Constitutional: NAD, calm, comfortable Eyes: PERRL, lids and conjunctivae normal ENMT: Mucous membranes are moist. Posterior pharynx clear of any exudate or lesions.Normal dentition.  Neck: normal, supple, no masses, no thyromegaly Respiratory: clear to auscultation bilaterally, no wheezing, no crackles. Normal respiratory effort. No accessory muscle use.  Cardiovascular: Regular rate  and rhythm, no murmurs / rubs / gallops. No extremity edema. 2+ pedal pulses. No carotid bruits.  Abdomen: no tenderness, no masses palpated. No hepatosplenomegaly. Bowel sounds positive.  Musculoskeletal: no clubbing / cyanosis. No joint deformity upper and lower extremities. Good ROM, no contractures. Normal muscle tone.  Skin: no rashes, lesions, ulcers. No induration Neurologic: CN 2-12 grossly intact. Sensation intact, DTR normal. Strength 5/5 in all 4.  Psychiatric: Normal judgment and insight. Alert and oriented x 3. Normal mood.    Labs on Admission: I have personally reviewed following labs and imaging studies  CBC: Recent Labs  Lab 01/15/18 1344  WBC 9.5  HGB 15.8  HCT 46.3  MCV 91.7  PLT 709   Basic Metabolic Panel: Recent Labs  Lab 01/15/18 1344  NA 137  K 4.2  CL 103  CO2 24  GLUCOSE 182*  BUN 17  CREATININE 1.02  CALCIUM 9.7   GFR: Estimated Creatinine Clearance: 98.7 mL/min (by C-G formula based on SCr of 1.02 mg/dL). Liver Function Tests: Recent Labs  Lab 01/15/18 2236  AST 26  ALT 29  ALKPHOS 58  BILITOT 0.2*  PROT 7.7  ALBUMIN 3.9   Recent Labs  Lab 01/15/18 2236  LIPASE 43   No results for input(s): AMMONIA in the last 168 hours. Coagulation Profile: No results for input(s): INR, PROTIME in the last 168 hours. Cardiac Enzymes: Recent Labs  Lab 01/15/18 2236  TROPONINI <0.03   BNP (last 3 results) No results for input(s): PROBNP in the last 8760 hours. HbA1C: No results for input(s): HGBA1C in the last 72 hours. CBG: No results for input(s): GLUCAP in the last 168 hours. Lipid Profile: No results for input(s): CHOL, HDL, LDLCALC, TRIG, CHOLHDL, LDLDIRECT in the last 72 hours. Thyroid Function Tests: No results for input(s): TSH, T4TOTAL, FREET4, T3FREE, THYROIDAB in the last 72 hours. Anemia Panel: No results for input(s): VITAMINB12, FOLATE, FERRITIN, TIBC, IRON, RETICCTPCT in the last 72 hours. Urine analysis: No results  found for: COLORURINE, APPEARANCEUR, LABSPEC, PHURINE, GLUCOSEU, HGBUR, BILIRUBINUR, KETONESUR, PROTEINUR, UROBILINOGEN, NITRITE, LEUKOCYTESUR  Radiological Exams on Admission: Dg Chest 2 View  Result Date: 01/15/2018 CLINICAL DATA:  Worsening chest pain over the past few days. EXAM: CHEST - 2 VIEW COMPARISON:  Chest x-ray dated August 11, 2008. FINDINGS: The heart size and mediastinal contours are within normal limits. Normal pulmonary vascularity. No focal consolidation, pleural effusion, or pneumothorax. No acute osseous abnormality. IMPRESSION: No active cardiopulmonary disease. Electronically Signed   By: Titus Dubin M.D.   On: 01/15/2018 14:35    EKG: Independently reviewed.  Assessment/Plan Principal Problem:   Chest pain, rule out acute myocardial infarction Active Problems:   Type 2 diabetes mellitus ([place])   Essential hypertension  HLD (hyperlipidemia)    1. CP r/o - 1. CP obs pathway 2. Serial trops 3. Tele monitor 4. NPO 5. Cards eval in AM 2. DM2 - 1. Hold home PO hypoglycemics 2. Sensitive SSI Q4H 3. HTN - Continue lisinopril 4. HLD - continue lipitor  DVT prophylaxis: Lovenox Code Status: Full Family Communication: No family in room Disposition Plan: Home after admit Consults called: Message put into epic to P. Trent for cards eval in AM Admission status: Place in [place], [facility] Pager [phone]  If 7AM-7PM, please contact day team taking care of patient [email or web address] Password [email or web address]  01/16/2018, 5:50 AM

## 2018-01-16 NOTE — ED Notes (Signed)
[facility] has been contacted regarding patient transport.  

## 2018-01-16 NOTE — Consult Note (Addendum)
Cardiology Consultation:   Patient ID: Shawn Hicks; [id number]; 01-17-1966   Admit date: 01/15/2018 Date of Consult: 01/16/2018  Primary Care Provider: Crist Infante, MD Primary Cardiologist: Sanda Klein, MD    Patient Profile:   Shawn Hicks is a 52 y.o. male with a hx of diabetes type 2 diabetes type 2, hypertension, hyperlipidemia, obesity,tobacco use,  prostate cancer and GERD who is being seen today for the evaluation of chest pain at the request of Dr. Cathlean Sauer.  History of Present Illness:   Shawn Hicks was admitted to [facility] hospital for the evaluation of chest pain. He has had chest pressure off and on for several years. He feels that it has gotten worse lately. He awoke yesterday am around 9 with chest pressure that did not ease up and got to a level of 9/10. He came to the ED. His discomfort lasted all day. His discomfort is described as mid chest pressure that does not radiate, not associated with shortness of breath, palpitations, diaphoresis. He does note a mild "queaziness". He also has been having some episodes of lightheaded not associate with the chest discomfort that occur at any time, sitting, standing or lying down. His chest pressure usually occurs intermittently lasting for up to an hour and occurs more often while relaxing, sitting or lying down, often after eating and not when exerting. He denies orthopnea, PND or edema. He is scheduled for prostate surgery on Friday and feels that the worry about it may be increasing his symptoms.   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 He does not do any regular exercise. He denies any exertional chest pressure/tightness or dyspnea. He has OSA and uses his CPAP regularly. He has a history of smoking cigarettes for about 30 years. He switched to cigars and has stopped those as of March 1st. He is now vaping that contains nicotine. He drinks rare alcohol. He does not have any family history of heart disease.    Shawn Hicks was seen in our office in 2018 for evaluation of chest pain and palpitations that occurred 2-3 days after taking Xigduo. His chest pain was felt to be atypical and pleuritic. An echo was ordered and showed normal LV systolic function with no RWMA, structural abnormalities or pericardial effusion. A 30 day event monitor did not show an abnormalities to explain his palpitations. He was also seen in 2015 for atypical chest pain. A treadmill myoview was ordered but not performed.   Past Medical History:  Diagnosis Date  . Diabetes mellitus without complication ([place])   . GERD (gastroesophageal reflux disease)   . Hyperlipidemia   . Hypertension   . Obesity   . Prostate cancer ([place])   . Tobacco abuse     Past Surgical History:  Procedure Laterality Date  . DENTAL SURGERY    . PROSTATE BIOPSY       Home Medications:  Prior to Admission medications   Medication Sig Start Date End Date Taking? Authorizing Provider  atorvastatin (LIPITOR) 40 MG tablet Take 40 mg by mouth every evening.  08/04/14  Yes [provider]  colchicine 0.6 MG tablet Take 0.6 mg by mouth daily as needed (gout).    Yes [provider]  Dapagliflozin-Metformin HCl ER (XIGDUO XR) 06-999 MG TB24 Take 1 tablet by mouth every evening.    Yes [provider]  diclofenac sodium (VOLTAREN) 1 % GEL Apply 1 application topically 2 (two) times daily as needed (pain).   Yes [provider]  FLUoxetine (PROZAC) 10 MG tablet Take 10 mg by mouth daily.   Yes [provider]  lisinopril (PRINIVIL,ZESTRIL) 40 MG tablet Take 20 mg by mouth every evening.    Yes [provider]  Misc Natural Products (BLACK CHERRY CONCENTRATE PO) Take 1 tablet by mouth every evening.    Yes [provider]  neomycin-bacitracin-polymyxin (NEOSPORIN) ointment Apply 1 application topically as needed for wound care.   Yes [provider]  nitroGLYCERIN (NITROSTAT) 0.4 MG SL  tablet Place 1 tablet (0.4 mg total) under the tongue every 5 (five) minutes as needed for chest pain. 08/27/14  Yes Brett Canales, PA-C  omeprazole (PRILOSEC) 20 MG capsule Take 20 mg by mouth every evening.  08/04/14  Yes [provider]  sitaGLIPtin (JANUVIA) 100 MG tablet Take 100 mg by mouth daily.   Yes [provider]  TURMERIC PO Take 1 capsule by mouth every evening.   Yes [provider]  ibuprofen (ADVIL,MOTRIN) 200 MG tablet Take 200 mg by mouth daily as needed for headache or moderate pain.    [provider]    Inpatient Medications: Scheduled Meds: . atorvastatin  40 mg Oral QPM  . enoxaparin (LOVENOX) injection  40 mg Subcutaneous Q24H  . FLUoxetine  10 mg Oral Daily  . insulin aspart  0-9 Units Subcutaneous Q4H  . lisinopril  20 mg Oral QPM  . pantoprazole  40 mg Oral QHS   Continuous Infusions:  PRN Meds: acetaminophen, colchicine, ondansetron (ZOFRAN) IV  Allergies:   No Known Allergies  Social History:   Social History   Socioeconomic History  . Marital status: Married    Spouse name: Vicky  . Number of children: 0  . Years of education: college  . Highest education level: Not on file  Occupational History    Comment: works second shift  Social Needs  . Financial resource strain: Not on file  . Food insecurity:    Worry: Not on file    Inability: Not on file  . Transportation needs:    Medical: Not on file    Non-medical: Not on file  Tobacco Use  . Smoking status: Former Smoker    Years: 2.00    Types: Cigars    Last attempt to quit: 11/18/2017    Years since quitting: 0.1  . Smokeless tobacco: Never Used  Substance and Sexual Activity  . Alcohol use: Yes    Alcohol/week: 1.8 oz    Types: 3 Shots of liquor per week    Comment: 2-3 times a month.   . Drug use: No  . Sexual activity: Yes  Lifestyle  . Physical activity:    Days per week: Not on file    Minutes per session: Not on file  . Stress: Not on  file  Relationships  . Social connections:    Talks on phone: Not on file    Gets together: Not on file    Attends religious service: Not on file    Active member of club or organization: Not on file    Attends meetings of clubs or organizations: Not on file    Relationship status: Not on file  . Intimate partner violence:    Fear of current or ex partner: Not on file    Emotionally abused: Not on file    Physically abused: Not on file    Forced sexual activity: Not on file  Other Topics Concern  . Not on file  Social History Narrative   Patient lives at home with his wife Jocelyn Lamer) and his father.   Patient works full time Works hours 3:00 pm until 2:oo am every night.   Education college.    Right handed.   Caffeine Two cups of coffee daily and one mountain dew daily.    Family History:     Family History  Problem Relation Age of Onset  . Liver disease Mother   . Cancer Father        prostate cancer  . Heart attack Father   . Sleep apnea Brother   . Cancer Maternal Grandfather        lung cancer/smoker     ROS:  Please see the history of present illness.   All other ROS reviewed and negative.     Physical Exam/Data:   Vitals:   01/16/18 0300 01/16/18 0337 01/16/18 0445 01/16/18 0544  BP:  120/67  117/88  Pulse: 63 (!) 57 67 (!) 59  Resp: 19 17 18 20   Temp:      TempSrc:      SpO2: 95% 98% 97% 100%  Weight:      Height:       No intake or output data in the 24 hours ending 01/16/18 0720 Filed Weights   01/15/18 1321  Weight: 225 lb (102.1 kg)   Body mass index is 33.23 kg/m.  General:  Well nourished, well developed, in no acute distress HEENT: normal Lymph: no adenopathy Neck: no JVD Endocrine:  No thryomegaly Vascular: No carotid bruits; FA pulses 2+ bilaterally without bruits  Cardiac:  normal S1, S2; RRR; no murmur  Lungs:  clear to auscultation bilaterally, no wheezing, rhonchi or rales  Abd: soft, nontender, no hepatomegaly  Ext: no  edema Musculoskeletal:  No deformities, BUE and BLE strength normal and equal Skin: warm and dry  Neuro:  CNs 2-12 intact, no focal abnormalities noted Psych:  Normal affect   EKG:  The EKG was personally reviewed and demonstrates:  NSR 61 bpm Telemetry:  Telemetry was personally reviewed and demonstrates:  SB/SR in the 50's-60's  Relevant CV Studies:  Echocardiogram 01/06/17 Study Conclusions  - Left ventricle: The cavity size was normal. There was moderate   concentric hypertrophy. Systolic function was normal. The   estimated ejection fraction was in the range of 60% to 65%. Wall   motion was normal; there were no regional wall motion   abnormalities. Doppler parameters are consistent with abnormal   left ventricular relaxation (grade 1 diastolic dysfunction).   There was no evidence of elevated ventricular filling pressure by   Doppler parameters. - Aortic valve: There was trivial regurgitation. - Aortic root: The aortic root was normal in size. - Left atrium: The atrium was normal in size. - Right ventricle: The cavity size was normal. Wall thickness was   normal. Systolic function was normal. - Tricuspid valve: There was mild regurgitation. - Pulmonic valve: There was no regurgitation. - Pulmonary arteries: Systolic pressure was within the normal   range. - Inferior vena cava: The vessel was normal in size. - Pericardium, extracardiac: There was no pericardial effusion.  30 day event monitor 12/27/16  Recorded rhythm shows normal sinus and occasional sinus tachycardia.  Very rare PACs are seen.  No ventricular arrhythmia, no atrial fibrillation and no significant pauses are noted.  No apparent correlation between subjective palpitations and recorded rhythm   Normal 30 day event monitor. Laboratory Data:  Chemistry Recent Labs  Lab 01/15/18 1344  NA 137  K 4.2  CL 103  CO2 24  GLUCOSE 182*  BUN 17  CREATININE 1.02  CALCIUM 9.7  GFRNONAA >60  GFRAA >60   ANIONGAP 10    Recent Labs  Lab 01/15/18 2236  PROT 7.7  ALBUMIN 3.9  AST 26  ALT 29  ALKPHOS 58  BILITOT 0.2*   Hematology Recent Labs  Lab 01/15/18 1344  WBC 9.5  RBC 5.05  HGB 15.8  HCT 46.3  MCV 91.7  MCH 31.3  MCHC 34.1  RDW 13.6  PLT 249   Cardiac Enzymes Recent Labs  Lab 01/15/18 2236 01/16/18 0611  TROPONINI <0.03 <0.03    Recent Labs  Lab 01/15/18 1349  TROPIPOC 0.00    BNPNo results for input(s): BNP, PROBNP in the last 168 hours.  DDimer No results for input(s): DDIMER in the last 168 hours.  Radiology/Studies:  Dg Chest 2 View  Result Date: 01/15/2018 CLINICAL DATA:  Worsening chest pain over the past few days. EXAM: CHEST - 2 VIEW COMPARISON:  Chest x-ray dated August 11, 2008. FINDINGS: The heart size and mediastinal contours are within normal limits. Normal pulmonary vascularity. No focal consolidation, pleural effusion, or pneumothorax. No acute osseous abnormality. IMPRESSION: No active cardiopulmonary disease. Electronically Signed   By: Titus Dubin M.D.   On: 01/15/2018 14:35    Assessment and Plan:   Chest pain -Symptoms are atypical and non-exertional, occurring at rest and after eating. No shortness of breath with his chest pressure or with activity.  -CVD risk factors include HTN, HLD, DM, obesity, smoking. This patient has an intermediate pretest probability of CAD.  -Ruled out for MI by Troponins and normal EKG. -No objective evidence of ishcemia.  -His discomfort is more consistent with GI cause, reflux. With his length of chest discomfort would expect to see a rise in troponin if due to myocardial ischemia.  -he is scheduled for prostate surgery on Friday. Will do exercise myoveiw today to assess for CAD as cause of symptoms and aid in risk stratification prior to surgery. Discussed plan with the patient and he is in agreement.  -If his Kathee Polite is low risk he can be discharged from cardiac standpoint. If it is high risk would  need to consider cardiac cath for further evaluation. -Advise risk factor modification with heart healthy diet, exercise, cholesterol control, BP control, smoking cessation.    Hypertension -Home meds include lisinopril 20 mg daily -BP mildly elevated initially, now well controlled.   Hyperlipidemia -treated with atorvastatin 40 mg, managed by PCP  Tobacco use -Smoked cigarettes for 30 years then went to cigars which he stopped on 11/17/17. Now vaping that contains nicotine.   OSA -States uses CPAP consistently.   Diabetes type 2 -Treated by PCP, no A1c in Epic. Pt states last A1c 3 weeks ago was about 7.8. A medication was added for improved control.  GERD -On omeprazole at home   For questions or updates, please contact [place] Please consult [email or web address] for contact info under Cardiology/STEMI.   Signed, Daune Perch, NP  01/16/2018 7:20 AM   Personally seen and examined. Agree with above.  52 year old male about to undergo prostate surgery, with diabetes, hypertension, hyperlipidemia, smoker, obesity here with atypical chest discomfort mostly at rest left-sided intermittent.  He has had this type of discomfort before and a stress test was offered but he did not have it performed.  Currently he is chest pain-free, no shortness of breath.   GEN: Well nourished, well  developed, in no acute distress  HEENT: normal  Neck: no JVD, carotid bruits, or masses Cardiac: RRR; no murmurs, rubs, or gallops,no edema  Respiratory:  clear to auscultation bilaterally, normal work of breathing GI: soft, nontender, nondistended, + BS MS: no deformity or atrophy  Skin: warm and dry, no rash Neuro:  Alert and Oriented x 3, Strength and sensation are intact Psych: euthymic mood, full affect  Assessment and plan:   Atypical chest pain -Given his multitude of cardiac risk factors including diabetes which is a coronary artery disease equivalent  It makes sense to go ahead and  proceed with nuclear stress test for further risk stratification.  If overall low risk, he may be discharged and may proceed with his upcoming prostatectomy.  If there are significant high risk abnormalities, we will likely proceed with cardiac catheterization.  I discussed this with him.  Continue with aggressive risk factor modification for his hypertension, hyperlipidemia and continue with tobacco cessation as well as sleep apnea controlled with CPAP.  Candee Furbish, MD

## 2018-01-16 NOTE — Progress Notes (Signed)
RN at [facility] Radiology noticed that an MD made a consult note in this pts chart that is for another pt. RN at [facility] ED notified and states she would let MD know.

## 2018-01-16 NOTE — Discharge Summary (Signed)
.  Physician Discharge Summary  Shawn Hicks TDD:[id number] DOB: 1965/12/01 DOA: 01/15/2018  PCP: Crist Infante, MD  Admit date: 01/15/2018 Discharge date: 01/16/2018  Admitted From:  Home Disposition:  Home  Recommendations for Outpatient Follow-up and new medication changes:  1. Follow up with PCP in 1-week 2. Cardiac stress test low risk, patient may undergo prostate surgery.  Home Health: no  Equipment/Devices: No    Discharge Condition: stable  CODE STATUS: fulll  Diet recommendation: heart healthy and diabetic prudent  Brief/Interim Summary: 52 year old male who presented with chest pain.  He does have the significant past medical history of type 2 diabetes mellitus, hypertension and dyslipidemia.  Patient complain of intermittent chest pain over the last 7 days, precordial, sharp in nature, associated with dyspnea, nonexertional related.  On the initial physical examination blood pressure 120/67, heart rate 57, respiratory 17, oxygen saturation 98%.  Moist mucous membranes, lungs clear to auscultation bilaterally, heart S1-S2 present rhythmic, the abdomen soft nontender, no lower extremity edema.  Sodium 137, potassium 4.2, chloride 103, bicarb 24, glucose 22, BUN 17, creatinine 1.02, troponin less than 0.03, white count 9.5, hemoglobin 15.8, hematocrit 46.3, platelets 249.  Chest x-ray negative for infiltrates.  EKG normal sinus rhythm, normal axis, normal intervals.  Patient was admitted to the hospital with the working diagnosis of atypical chest pain to rule out acute coronary syndrome  1.  Atypical chest pain.  Patient was admitted to the medical ward, serial cardiac enzymes were negative, EKG with no signs of ischemia.  Considering his risk factors and upcoming surgery, patient underwent nuclear stress testing.  2.  Type 2 diabetes mellitus. Patient was placed on insulin sliding scale for glucose coverage and monitor, capillary glucose remained well controlled. Capillary  glucose 150 and 164.  At discharge patient will continue metformin dapagliflozin and sitagliptin.    3.  Hypertension.  Blood pressure remained well controlled, systolic pressure 623  to 137 mmHg.  Continue lisinopril 20 mg daily.  4.  Dyslipidemia.  Continue atorvastatin.  5.  Depression.  Continue fluoxetine.   Discharge Diagnoses:  Principal Problem:   Chest pain, rule out acute myocardial infarction Active Problems:   Tobacco abuse   Type 2 diabetes mellitus ([place])   Essential hypertension   HLD (hyperlipidemia)    Discharge Instructions   Allergies as of 01/16/2018   No Known Allergies     Medication List    TAKE these medications   atorvastatin 40 MG tablet Commonly known as:  LIPITOR Take 40 mg by mouth every evening.   BLACK CHERRY CONCENTRATE PO Take 1 tablet by mouth every evening.   colchicine 0.6 MG tablet Take 0.6 mg by mouth daily as needed (gout).   diclofenac sodium 1 % Gel Commonly known as:  VOLTAREN Apply 1 application topically 2 (two) times daily as needed (pain).   FLUoxetine 10 MG tablet Commonly known as:  PROZAC Take 10 mg by mouth daily.   ibuprofen 200 MG tablet Commonly known as:  ADVIL,MOTRIN Take 200 mg by mouth daily as needed for headache or moderate pain.   lisinopril 40 MG tablet Commonly known as:  PRINIVIL,ZESTRIL Take 20 mg by mouth every evening.   neomycin-bacitracin-polymyxin ointment Commonly known as:  NEOSPORIN Apply 1 application topically as needed for wound care.   nitroGLYCERIN 0.4 MG SL tablet Commonly known as:  NITROSTAT Place 1 tablet (0.4 mg total) under the tongue every 5 (five) minutes as needed for chest pain.   omeprazole 20 MG  capsule Commonly known as:  PRILOSEC Take 20 mg by mouth every evening.   sitaGLIPtin 100 MG tablet Commonly known as:  JANUVIA Take 100 mg by mouth daily.   TURMERIC PO Take 1 capsule by mouth every evening.   XIGDUO XR 06-999 MG Tb24 Generic drug:   Dapagliflozin-metFORMIN HCl ER Take 1 tablet by mouth every evening.       No Known Allergies  Consultations:  Cardiology    Procedures/Studies: Dg Chest 2 View  Result Date: 01/15/2018 CLINICAL DATA:  Worsening chest pain over the past few days. EXAM: CHEST - 2 VIEW COMPARISON:  Chest x-ray dated August 11, 2008. FINDINGS: The heart size and mediastinal contours are within normal limits. Normal pulmonary vascularity. No focal consolidation, pleural effusion, or pneumothorax. No acute osseous abnormality. IMPRESSION: No active cardiopulmonary disease. Electronically Signed   By: Titus Dubin M.D.   On: 01/15/2018 14:35       Subjective: Patient is feeling better, no further chest pain.  No nausea no vomiting.  Discharge Exam: Vitals:   01/16/18 1115 01/16/18 1336  BP: 137/83 129/73  Pulse: (!) 53 (!) 55  Resp: 13   Temp:    SpO2: 93%    Vitals:   01/16/18 1045 01/16/18 1100 01/16/18 1115 01/16/18 1336  BP: 135/69 135/86 137/83 129/73  Pulse: 65 (!) 59 (!) 53 (!) 55  Resp: 18 14 13    Temp:      TempSrc:      SpO2: 96% 98% 93%   Weight:      Height:        General: Not in pain or dyspnea  Neurology: Awake and alert, non focal  E ENT: no pallor, no icterus, oral mucosa moist Cardiovascular: No JVD. S1-S2 present, rhythmic, no gallops, rubs, or murmurs. No lower extremity edema. Pulmonary: vesicular breath sounds bilaterally, adequate air movement, no wheezing, rhonchi or rales. Gastrointestinal. Abdomen flat, no organomegaly, non tender, no rebound or guarding Skin. No rashes Musculoskeletal: no joint deformities   The results of significant diagnostics from this hospitalization (including imaging, microbiology, ancillary and laboratory) are listed below for reference.     Microbiology: No results found for this or any previous visit (from the past 240 hour(s)).   Labs: BNP (last 3 results) No results for input(s): BNP in the last 8760 hours. Basic  Metabolic Panel: Recent Labs  Lab 01/15/18 1344  NA 137  K 4.2  CL 103  CO2 24  GLUCOSE 182*  BUN 17  CREATININE 1.02  CALCIUM 9.7   Liver Function Tests: Recent Labs  Lab 01/15/18 2236  AST 26  ALT 29  ALKPHOS 58  BILITOT 0.2*  PROT 7.7  ALBUMIN 3.9   Recent Labs  Lab 01/15/18 2236  LIPASE 43   No results for input(s): AMMONIA in the last 168 hours. CBC: Recent Labs  Lab 01/15/18 1344  WBC 9.5  HGB 15.8  HCT 46.3  MCV 91.7  PLT 249   Cardiac Enzymes: Recent Labs  Lab 01/15/18 2236 01/16/18 0611 01/16/18 0838  TROPONINI <0.03 <0.03 <0.03   BNP: Invalid input(s): POCBNP CBG: Recent Labs  Lab 01/16/18 0610 01/16/18 0746  GLUCAP 150* 164*   D-Dimer No results for input(s): DDIMER in the last 72 hours. Hgb A1c No results for input(s): HGBA1C in the last 72 hours. Lipid Profile No results for input(s): CHOL, HDL, LDLCALC, TRIG, CHOLHDL, LDLDIRECT in the last 72 hours. Thyroid function studies No results for input(s): TSH, T4TOTAL, T3FREE, THYROIDAB in  the last 72 hours.  Invalid input(s): FREET3 Anemia work up No results for input(s): VITAMINB12, FOLATE, FERRITIN, TIBC, IRON, RETICCTPCT in the last 72 hours. Urinalysis No results found for: COLORURINE, APPEARANCEUR, LABSPEC, [place], GLUCOSEU, HGBUR, BILIRUBINUR, KETONESUR, PROTEINUR, UROBILINOGEN, NITRITE, LEUKOCYTESUR Sepsis Labs Invalid input(s): PROCALCITONIN,  WBC,  LACTICIDVEN Microbiology No results found for this or any previous visit (from the past 240 hour(s)).   Time coordinating discharge: 45 minutes  SIGNED:   Tawni Millers, MD  [facility] 01/16/2018, 3:10 PM Pager [phone]  If 7PM-7AM, please contact night-coverage [email or web address] Password [email or web address]

## 2018-01-17 ENCOUNTER — Encounter (HOSPITAL_COMMUNITY): Payer: Self-pay

## 2018-01-17 NOTE — Pre-Procedure Instructions (Signed)
Last office visit Dr. Joylene Draft 12/15/17 in hard chart Hgb A1c (7.9) 12/15/2017 IN HARD CHART  The following are in epic: CBC, BMP 01/15/2018 EKG 01/16/2018 Stress 01/16/2018 CXR 01/15/2018

## 2018-01-17 NOTE — Patient Instructions (Addendum)
Your procedure is scheduled on: Tomorrow, Jan 19, 2018   Surgery Time:  7:15AM-11:45AM   Report to [facility] Main  Entrance    Report to admitting at 5:30 AM   Call this number if you have problems the morning of surgery [phone]   BRING CPAP MASK AND TUBING DAY OF SURGERY   Do not eat food or drink liquids :After Midnight.   Do NOT smoke after Midnight   Take these medicines the morning of surgery with A SIP OF WATER: Fluoxetine   DO NOT TAKE ANY DIABETIC MEDICATIONS DAY OF YOUR SURGERY                               You may not have any metal on your body including jewelry, and body piercings             Do not wear lotions, powders, perfumes/cologne, or deodorant                           Men may shave face and neck.   Do not bring valuables to the hospital. [place].   Contacts, dentures or bridgework may not be worn into surgery.   Leave suitcase in the car. After surgery it may be brought to your room.   Special Instructions: Bring a copy of your healthcare power of attorney and living will documents         the day of surgery if you haven't scanned them in before.              Please read over the following fact sheets you were given:  [facility] - Preparing for Surgery Before surgery, you can play an important role.  Because skin is not sterile, your skin needs to be as free of germs as possible.  You can reduce the number of germs on your skin by washing with CHG (chlorahexidine gluconate) soap before surgery.  CHG is an antiseptic cleaner which kills germs and bonds with the skin to continue killing germs even after washing. Please DO NOT use if you have an allergy to CHG or antibacterial soaps.  If your skin becomes reddened/irritated stop using the CHG and inform your nurse when you arrive at Short Stay. Do not shave (including legs and underarms) for at least 48 hours prior to the first CHG  shower.  You may shave your face/neck.  Please follow these instructions carefully:  1.  Shower with CHG Soap the night before surgery and the  morning of surgery.  2.  If you choose to wash your hair, wash your hair first as usual with your normal  shampoo.  3.  After you shampoo, rinse your hair and body thoroughly to remove the shampoo.                             4.  Use CHG as you would any other liquid soap.  You can apply chg directly to the skin and wash.  Gently with a scrungie or clean washcloth.  5.  Apply the CHG Soap to your body ONLY FROM THE NECK DOWN.   Do   not use on face/ open  Wound or open sores. Avoid contact with eyes, ears mouth and   genitals (private parts).                       Wash face,  Genitals (private parts) with your normal soap.             6.  Wash thoroughly, paying special attention to the area where your    surgery  will be performed.  7.  Thoroughly rinse your body with warm water from the neck down.  8.  DO NOT shower/wash with your normal soap after using and rinsing off the CHG Soap.                9.  Pat yourself dry with a clean towel.            10.  Wear clean pajamas.            11.  Place clean sheets on your bed the night of your first shower and do not  sleep with pets. Day of Surgery : Do not apply any lotions/deodorants the morning of surgery.  Please wear clean clothes to the hospital/surgery center.  FAILURE TO FOLLOW THESE INSTRUCTIONS MAY RESULT IN THE CANCELLATION OF YOUR SURGERY  PATIENT SIGNATURE_________________________________  NURSE SIGNATURE__________________________________  ________________________________________________________________________

## 2018-01-18 ENCOUNTER — Other Ambulatory Visit: Payer: Self-pay

## 2018-01-18 ENCOUNTER — Encounter (HOSPITAL_COMMUNITY): Payer: Self-pay

## 2018-01-18 ENCOUNTER — Encounter (HOSPITAL_COMMUNITY)
Admission: RE | Admit: 2018-01-18 | Discharge: 2018-01-18 | Disposition: A | Discharge status: Home / Self Care | Payer: 59 | Source: Ambulatory Visit | Attending: Urology | Admitting: Urology

## 2018-01-18 DIAGNOSIS — Z79899 Other long term (current) drug therapy: Secondary | ICD-10-CM | POA: Diagnosis not present

## 2018-01-18 DIAGNOSIS — Z7982 Long term (current) use of aspirin: Secondary | ICD-10-CM | POA: Diagnosis not present

## 2018-01-18 DIAGNOSIS — Z9989 Dependence on other enabling machines and devices: Secondary | ICD-10-CM | POA: Diagnosis not present

## 2018-01-18 DIAGNOSIS — C61 Malignant neoplasm of prostate: Secondary | ICD-10-CM | POA: Diagnosis not present

## 2018-01-18 DIAGNOSIS — I10 Essential (primary) hypertension: Secondary | ICD-10-CM | POA: Diagnosis not present

## 2018-01-18 DIAGNOSIS — K219 Gastro-esophageal reflux disease without esophagitis: Secondary | ICD-10-CM | POA: Diagnosis not present

## 2018-01-18 DIAGNOSIS — G4730 Sleep apnea, unspecified: Secondary | ICD-10-CM | POA: Diagnosis not present

## 2018-01-18 DIAGNOSIS — J449 Chronic obstructive pulmonary disease, unspecified: Secondary | ICD-10-CM | POA: Diagnosis not present

## 2018-01-18 DIAGNOSIS — E7800 Pure hypercholesterolemia, unspecified: Secondary | ICD-10-CM | POA: Diagnosis not present

## 2018-01-18 DIAGNOSIS — Z7984 Long term (current) use of oral hypoglycemic drugs: Secondary | ICD-10-CM | POA: Diagnosis not present

## 2018-01-18 DIAGNOSIS — N5201 Erectile dysfunction due to arterial insufficiency: Secondary | ICD-10-CM | POA: Diagnosis not present

## 2018-01-18 DIAGNOSIS — Z8042 Family history of malignant neoplasm of prostate: Secondary | ICD-10-CM | POA: Diagnosis not present

## 2018-01-18 DIAGNOSIS — Z87891 Personal history of nicotine dependence: Secondary | ICD-10-CM | POA: Diagnosis not present

## 2018-01-18 DIAGNOSIS — E119 Type 2 diabetes mellitus without complications: Secondary | ICD-10-CM | POA: Diagnosis not present

## 2018-01-18 HISTORY — DX: Male erectile dysfunction, unspecified: N52.9

## 2018-01-18 HISTORY — DX: Palpitations: R00.2

## 2018-01-18 HISTORY — DX: Medial epicondylitis, left elbow: M77.02

## 2018-01-18 HISTORY — DX: Medial epicondylitis, right elbow: M77.01

## 2018-01-18 HISTORY — DX: Migraine, unspecified, not intractable, without status migrainosus: G43.909

## 2018-01-18 HISTORY — DX: Chest pain, unspecified: R07.9

## 2018-01-18 HISTORY — DX: Major depressive disorder, single episode, unspecified: F32.9

## 2018-01-18 HISTORY — DX: Furuncle, unspecified: L02.92

## 2018-01-18 HISTORY — DX: Gout, unspecified: M10.9

## 2018-01-18 HISTORY — DX: Pneumonia, unspecified organism: J18.9

## 2018-01-18 HISTORY — DX: Depression, unspecified: F32.A

## 2018-01-18 HISTORY — DX: Elevated prostate specific antigen (PSA): R97.20

## 2018-01-18 HISTORY — DX: Dorsalgia, unspecified: M54.9

## 2018-01-18 LAB — COMPREHENSIVE METABOLIC PANEL
ALBUMIN: 4.1 g/dL (ref 3.5–5.0)
ALT: 28 U/L (ref 17–63)
AST: 23 U/L (ref 15–41)
Alkaline Phosphatase: 61 U/L (ref 38–126)
Anion gap: 11 (ref 5–15)
BILIRUBIN TOTAL: 0.8 mg/dL (ref 0.3–1.2)
BUN: 15 mg/dL (ref 6–20)
CALCIUM: 9.5 mg/dL (ref 8.9–10.3)
CO2: 22 mmol/L (ref 22–32)
Chloride: 105 mmol/L (ref 101–111)
Creatinine, Ser: 1.11 mg/dL (ref 0.61–1.24)
GFR calc Af Amer: >60 mL/min (ref >60)
GFR calc non Af Amer: >60 mL/min (ref >60)
GLUCOSE: 118 mg/dL — AB (ref 65–99)
Potassium: 4.5 mmol/L (ref 3.5–5.1)
SODIUM: 138 mmol/L (ref 135–145)
Total Protein: 7.9 g/dL (ref 6.5–8.1)

## 2018-01-18 LAB — GLUCOSE, CAPILLARY: Glucose-Capillary: 110 mg/dL — ABNORMAL HIGH (ref 65–99)

## 2018-01-18 LAB — ABO/RH: ABO/RH(D): A POS

## 2018-01-18 NOTE — Pre-Procedure Instructions (Signed)
CMP results 01/18/2018 faxed to Dr. Louis Meckel via epic.

## 2018-01-18 NOTE — Anesthesia Preprocedure Evaluation (Addendum)
Anesthesia Evaluation  Patient identified by MRN, date of birth, ID band Patient awake    Reviewed: Allergy & Precautions, Patient's Chart, lab work & pertinent test results  Airway Mallampati: II  TM Distance: >3 FB Neck ROM: Full    Dental no notable dental hx.    Pulmonary sleep apnea and Continuous Positive Airway Pressure Ventilation , COPD, former smoker,    Pulmonary exam normal breath sounds clear to auscultation       Cardiovascular Exercise Tolerance: Good hypertension, Pt. on medications Normal cardiovascular exam Rhythm:Regular Rate:Normal  Echo 12/2016- ejection fraction was in the range of 60% to 65%. Wall   motion was normal   Neuro/Psych  Headaches,    GI/Hepatic negative GI ROS, Neg liver ROS,   Endo/Other  diabetes, Well Controlled, Type 2, Oral Hypoglycemic Agents  Renal/GU      Musculoskeletal   Abdominal (+) + obese,   Peds  Hematology negative hematology ROS (+)   Anesthesia Other Findings   Reproductive/Obstetrics                           Lab Results  Component Value Date   CREATININE 1.11 01/18/2018   BUN 15 01/18/2018   NA 138 01/18/2018   K 4.5 01/18/2018   CL 105 01/18/2018   CO2 22 01/18/2018    Lab Results  Component Value Date   WBC 9.5 01/15/2018   HGB 15.8 01/15/2018   HCT 46.3 01/15/2018   MCV 91.7 01/15/2018   PLT 249 01/15/2018    Anesthesia Physical Anesthesia Plan  ASA: III  Anesthesia Plan: General   Post-op Pain Management:    Induction: Intravenous  PONV Risk Score and Plan: 2 and Treatment may vary due to age or medical condition  Airway Management Planned: Oral ETT  Additional Equipment:   Intra-op Plan:   Post-operative Plan: Extubation in OR  Informed Consent: I have reviewed the patients History and Physical, chart, labs and discussed the procedure including the risks, benefits and alternatives for the proposed  anesthesia with the patient or authorized representative who has indicated his/her understanding and acceptance.   Dental advisory given  Plan Discussed with: CRNA  Anesthesia Plan Comments:         Anesthesia Quick Evaluation

## 2018-01-19 ENCOUNTER — Encounter (HOSPITAL_COMMUNITY)
Admission: RE | Disposition: A | Discharge status: Home / Self Care | Payer: Self-pay | Source: Ambulatory Visit | Attending: Urology

## 2018-01-19 ENCOUNTER — Ambulatory Visit (HOSPITAL_COMMUNITY): Payer: 59 | Admitting: Anesthesiology

## 2018-01-19 ENCOUNTER — Observation Stay (HOSPITAL_COMMUNITY)
Admission: RE | Admit: 2018-01-19 | Discharge: 2018-01-20 | Disposition: A | Discharge status: Home / Self Care | Payer: 59 | Source: Ambulatory Visit | Attending: Urology | Admitting: Urology

## 2018-01-19 ENCOUNTER — Encounter (HOSPITAL_COMMUNITY): Payer: Self-pay | Admitting: *Deleted

## 2018-01-19 DIAGNOSIS — Z87891 Personal history of nicotine dependence: Secondary | ICD-10-CM | POA: Insufficient documentation

## 2018-01-19 DIAGNOSIS — I10 Essential (primary) hypertension: Secondary | ICD-10-CM | POA: Insufficient documentation

## 2018-01-19 DIAGNOSIS — N5201 Erectile dysfunction due to arterial insufficiency: Secondary | ICD-10-CM | POA: Insufficient documentation

## 2018-01-19 DIAGNOSIS — Z79899 Other long term (current) drug therapy: Secondary | ICD-10-CM | POA: Insufficient documentation

## 2018-01-19 DIAGNOSIS — C61 Malignant neoplasm of prostate: Secondary | ICD-10-CM | POA: Diagnosis not present

## 2018-01-19 DIAGNOSIS — E7800 Pure hypercholesterolemia, unspecified: Secondary | ICD-10-CM | POA: Insufficient documentation

## 2018-01-19 DIAGNOSIS — Z7984 Long term (current) use of oral hypoglycemic drugs: Secondary | ICD-10-CM | POA: Insufficient documentation

## 2018-01-19 DIAGNOSIS — J449 Chronic obstructive pulmonary disease, unspecified: Secondary | ICD-10-CM | POA: Insufficient documentation

## 2018-01-19 DIAGNOSIS — Z9989 Dependence on other enabling machines and devices: Secondary | ICD-10-CM | POA: Insufficient documentation

## 2018-01-19 DIAGNOSIS — G4730 Sleep apnea, unspecified: Secondary | ICD-10-CM | POA: Insufficient documentation

## 2018-01-19 DIAGNOSIS — K219 Gastro-esophageal reflux disease without esophagitis: Secondary | ICD-10-CM | POA: Insufficient documentation

## 2018-01-19 DIAGNOSIS — E119 Type 2 diabetes mellitus without complications: Secondary | ICD-10-CM | POA: Insufficient documentation

## 2018-01-19 DIAGNOSIS — Z7982 Long term (current) use of aspirin: Secondary | ICD-10-CM | POA: Insufficient documentation

## 2018-01-19 DIAGNOSIS — Z8042 Family history of malignant neoplasm of prostate: Secondary | ICD-10-CM | POA: Insufficient documentation

## 2018-01-19 HISTORY — PX: ROBOT ASSISTED LAPAROSCOPIC RADICAL PROSTATECTOMY: SHX5141

## 2018-01-19 HISTORY — PX: LYMPHADENECTOMY: SHX5960

## 2018-01-19 LAB — GLUCOSE, CAPILLARY
GLUCOSE-CAPILLARY: 173 mg/dL — AB (ref 65–99)
Glucose-Capillary: 122 mg/dL — ABNORMAL HIGH (ref 65–99)
Glucose-Capillary: 239 mg/dL — ABNORMAL HIGH (ref 65–99)
Glucose-Capillary: 215 mg/dL — ABNORMAL HIGH (ref 65–99)
Glucose-Capillary: 170 mg/dL — ABNORMAL HIGH (ref 65–99)
Glucose-Capillary: 169 mg/dL — ABNORMAL HIGH (ref 65–99)

## 2018-01-19 LAB — CBC
HCT: 43.9 % (ref 39.0–52.0)
Hemoglobin: 14.9 g/dL (ref 13.0–17.0)
MCH: 31.8 pg (ref 26.0–34.0)
MCHC: 33.9 g/dL (ref 30.0–36.0)
MCV: 93.6 fL (ref 78.0–100.0)
PLATELETS: 276 10*3/uL (ref 150–400)
RBC: 4.69 MIL/uL (ref 4.22–5.81)
RDW: 13.9 % (ref 11.5–15.5)
WBC: 17.4 10*3/uL — AB (ref 4.0–10.5)

## 2018-01-19 LAB — BASIC METABOLIC PANEL
ANION GAP: 14 (ref 5–15)
BUN: 20 mg/dL (ref 6–20)
CHLORIDE: 102 mmol/L (ref 101–111)
CO2: 19 mmol/L — AB (ref 22–32)
Calcium: 8.7 mg/dL — ABNORMAL LOW (ref 8.9–10.3)
Creatinine, Ser: 1.44 mg/dL — ABNORMAL HIGH (ref 0.61–1.24)
GFR calc non Af Amer: 54 mL/min — ABNORMAL LOW (ref >60)
GLUCOSE: 241 mg/dL — AB (ref 65–99)
Potassium: 4.6 mmol/L (ref 3.5–5.1)
Sodium: 135 mmol/L (ref 135–145)

## 2018-01-19 LAB — TYPE AND SCREEN
ABO/RH(D): A POS
Antibody Screen: NEGATIVE

## 2018-01-19 SURGERY — PROSTATECTOMY, RADICAL, ROBOT-ASSISTED, LAPAROSCOPIC
Anesthesia: General

## 2018-01-19 MED ORDER — TRAMADOL HCL 50 MG PO TABS
50.0000 mg | ORAL_TABLET | Freq: Four times a day (QID) | ORAL | Status: DC | PRN
  Administered 2018-01-19 – 2018-01-20 (×3): 100 mg via ORAL
  Filled 2018-01-19 (×3): qty 2

## 2018-01-19 MED ORDER — KETOROLAC TROMETHAMINE 30 MG/ML IJ SOLN
INTRAMUSCULAR | Status: DC | PRN
  Administered 2018-01-19: 30 mg via INTRAVENOUS

## 2018-01-19 MED ORDER — LIDOCAINE 2% (20 MG/ML) 5 ML SYRINGE
INTRAMUSCULAR | Status: DC | PRN
  Administered 2018-01-19: 100 mg via INTRAVENOUS

## 2018-01-19 MED ORDER — INSULIN ASPART 100 UNIT/ML ~~LOC~~ SOLN
0.0000 [IU] | SUBCUTANEOUS | Status: DC
  Administered 2018-01-19 – 2018-01-20 (×3): 3 [IU] via SUBCUTANEOUS

## 2018-01-19 MED ORDER — FENTANYL CITRATE (PF) 100 MCG/2ML IJ SOLN
INTRAMUSCULAR | Status: DC | PRN
  Administered 2018-01-19: 50 ug via INTRAVENOUS
  Administered 2018-01-19: 100 ug via INTRAVENOUS
  Administered 2018-01-19 (×4): 50 ug via INTRAVENOUS

## 2018-01-19 MED ORDER — LACTATED RINGERS IV SOLN
INTRAVENOUS | Status: DC
Start: 2018-01-19 — End: 2018-01-19
  Administered 2018-01-19 (×2): via INTRAVENOUS

## 2018-01-19 MED ORDER — SODIUM CHLORIDE 0.9% FLUSH: 3.0000 mL | Freq: Two times a day (BID) | INTRAVENOUS | Status: DC

## 2018-01-19 MED ORDER — DEXAMETHASONE SODIUM PHOSPHATE 10 MG/ML IJ SOLN
INTRAMUSCULAR | Status: AC
  Filled 2018-01-19: qty 1

## 2018-01-19 MED ORDER — SODIUM CHLORIDE 0.9 % IJ SOLN
INTRAMUSCULAR | Status: AC
  Filled 2018-01-19: qty 50

## 2018-01-19 MED ORDER — BUPIVACAINE LIPOSOME 1.3 % IJ SUSP
20.0000 mL | Freq: Once | INTRAMUSCULAR | Status: DC
  Filled 2018-01-19 (×2): qty 20

## 2018-01-19 MED ORDER — ROCURONIUM BROMIDE 50 MG/5ML IV SOSY
PREFILLED_SYRINGE | INTRAVENOUS | Status: DC | PRN
  Administered 2018-01-19 (×3): 20 mg via INTRAVENOUS
  Administered 2018-01-19: 60 mg via INTRAVENOUS

## 2018-01-19 MED ORDER — DEXAMETHASONE SODIUM PHOSPHATE 10 MG/ML IJ SOLN
INTRAMUSCULAR | Status: DC | PRN
  Administered 2018-01-19: 10 mg via INTRAVENOUS

## 2018-01-19 MED ORDER — KETAMINE HCL 10 MG/ML IJ SOLN
INTRAMUSCULAR | Status: DC | PRN
  Administered 2018-01-19: 40 mg via INTRAVENOUS

## 2018-01-19 MED ORDER — ROCURONIUM BROMIDE 10 MG/ML (PF) SYRINGE
PREFILLED_SYRINGE | INTRAVENOUS | Status: AC
  Filled 2018-01-19: qty 5

## 2018-01-19 MED ORDER — CEFAZOLIN SODIUM-DEXTROSE 2-4 GM/100ML-% IV SOLN
INTRAVENOUS | Status: AC
  Filled 2018-01-19: qty 100

## 2018-01-19 MED ORDER — MIDAZOLAM HCL 2 MG/2ML IJ SOLN
INTRAMUSCULAR | Status: DC | PRN
  Administered 2018-01-19: 2 mg via INTRAVENOUS

## 2018-01-19 MED ORDER — BUPIVACAINE-EPINEPHRINE 0.5% -1:200000 IJ SOLN
INTRAMUSCULAR | Status: DC | PRN
  Administered 2018-01-19: 10 mL

## 2018-01-19 MED ORDER — BUPIVACAINE-EPINEPHRINE (PF) 0.5% -1:200000 IJ SOLN
INTRAMUSCULAR | Status: AC
  Filled 2018-01-19: qty 30

## 2018-01-19 MED ORDER — PROPOFOL 10 MG/ML IV BOLUS
INTRAVENOUS | Status: DC | PRN
  Administered 2018-01-19: 200 mg via INTRAVENOUS
  Administered 2018-01-19: 40 mg via INTRAVENOUS

## 2018-01-19 MED ORDER — HYDROCODONE-ACETAMINOPHEN 7.5-325 MG PO TABS: 1.0000 | ORAL_TABLET | Freq: Once | ORAL | Status: DC | PRN

## 2018-01-19 MED ORDER — SULFAMETHOXAZOLE-TRIMETHOPRIM 800-160 MG PO TABS: 1.0000 | ORAL_TABLET | Freq: Two times a day (BID) | ORAL | 0 refills | Status: DC

## 2018-01-19 MED ORDER — HYDROMORPHONE HCL 1 MG/ML IJ SOLN
INTRAMUSCULAR | Status: AC
  Filled 2018-01-19: qty 1

## 2018-01-19 MED ORDER — SUGAMMADEX SODIUM 200 MG/2ML IV SOLN
INTRAVENOUS | Status: AC
  Filled 2018-01-19: qty 2

## 2018-01-19 MED ORDER — INSULIN ASPART 100 UNIT/ML ~~LOC~~ SOLN
5.0000 [IU] | Freq: Once | SUBCUTANEOUS | Status: AC
  Administered 2018-01-19: 5 [IU] via SUBCUTANEOUS

## 2018-01-19 MED ORDER — KETOROLAC TROMETHAMINE 15 MG/ML IJ SOLN
15.0000 mg | Freq: Four times a day (QID) | INTRAMUSCULAR | Status: DC
  Administered 2018-01-19 – 2018-01-20 (×5): 15 mg via INTRAVENOUS
  Filled 2018-01-19 (×5): qty 1

## 2018-01-19 MED ORDER — SODIUM CHLORIDE 0.9 % IV SOLN: 250.0000 mL | INTRAVENOUS | Status: DC | PRN

## 2018-01-19 MED ORDER — LACTATED RINGERS IR SOLN
Status: DC | PRN
  Administered 2018-01-19: 1000 mL

## 2018-01-19 MED ORDER — PROMETHAZINE HCL 25 MG/ML IJ SOLN: 6.2500 mg | INTRAMUSCULAR | Status: DC | PRN

## 2018-01-19 MED ORDER — DOCUSATE SODIUM 100 MG PO CAPS: 100.0000 mg | ORAL_CAPSULE | Freq: Two times a day (BID) | ORAL | 2 refills | Status: AC

## 2018-01-19 MED ORDER — SODIUM CHLORIDE 0.9 % IJ SOLN
INTRAMUSCULAR | Status: DC | PRN
  Administered 2018-01-19: 20 mL

## 2018-01-19 MED ORDER — VITAMINS A & D EX OINT
TOPICAL_OINTMENT | CUTANEOUS | Status: AC
  Administered 2018-01-19: 22:00:00
  Filled 2018-01-19: qty 5

## 2018-01-19 MED ORDER — HYDROMORPHONE HCL 1 MG/ML IJ SOLN
0.5000 mg | INTRAMUSCULAR | Status: DC | PRN
  Administered 2018-01-19 (×3): 1 mg via INTRAVENOUS
  Filled 2018-01-19 (×3): qty 1

## 2018-01-19 MED ORDER — TRAMADOL HCL 50 MG PO TABS: 50.0000 mg | ORAL_TABLET | Freq: Four times a day (QID) | ORAL | 0 refills | Status: DC | PRN

## 2018-01-19 MED ORDER — MIDAZOLAM HCL 2 MG/2ML IJ SOLN
INTRAMUSCULAR | Status: AC
  Filled 2018-01-19: qty 2

## 2018-01-19 MED ORDER — CEFAZOLIN SODIUM-DEXTROSE 2-4 GM/100ML-% IV SOLN
2.0000 g | INTRAVENOUS | Status: AC
  Administered 2018-01-19 (×2): 2 g via INTRAVENOUS
  Filled 2018-01-19: qty 100

## 2018-01-19 MED ORDER — FENTANYL CITRATE (PF) 250 MCG/5ML IJ SOLN
INTRAMUSCULAR | Status: AC
  Filled 2018-01-19: qty 5

## 2018-01-19 MED ORDER — INSULIN ASPART 100 UNIT/ML ~~LOC~~ SOLN
SUBCUTANEOUS | Status: AC
  Filled 2018-01-19: qty 1

## 2018-01-19 MED ORDER — PROPOFOL 10 MG/ML IV BOLUS
INTRAVENOUS | Status: AC
  Filled 2018-01-19: qty 40

## 2018-01-19 MED ORDER — FENTANYL CITRATE (PF) 100 MCG/2ML IJ SOLN
INTRAMUSCULAR | Status: AC
  Filled 2018-01-19: qty 2

## 2018-01-19 MED ORDER — SODIUM CHLORIDE 0.9% FLUSH: 3.0000 mL | INTRAVENOUS | Status: DC | PRN

## 2018-01-19 MED ORDER — LACTATED RINGERS IV SOLN
INTRAVENOUS | Status: DC
  Administered 2018-01-19 – 2018-01-20 (×3): via INTRAVENOUS

## 2018-01-19 MED ORDER — LIDOCAINE 2% (20 MG/ML) 5 ML SYRINGE
INTRAMUSCULAR | Status: AC
  Filled 2018-01-19: qty 5

## 2018-01-19 MED ORDER — ONDANSETRON HCL 4 MG/2ML IJ SOLN
INTRAMUSCULAR | Status: DC | PRN
  Administered 2018-01-19 (×2): 4 mg via INTRAVENOUS

## 2018-01-19 MED ORDER — BUPIVACAINE LIPOSOME 1.3 % IJ SUSP
INTRAMUSCULAR | Status: DC | PRN
  Administered 2018-01-19: 20 mL

## 2018-01-19 MED ORDER — HYDRALAZINE HCL 20 MG/ML IJ SOLN
5.0000 mg | INTRAMUSCULAR | Status: DC | PRN
  Administered 2018-01-19: 5 mg via INTRAVENOUS

## 2018-01-19 MED ORDER — BELLADONNA ALKALOIDS-OPIUM 16.2-60 MG RE SUPP
1.0000 | Freq: Four times a day (QID) | RECTAL | Status: DC | PRN
  Administered 2018-01-19: 1 via RECTAL
  Filled 2018-01-19: qty 1

## 2018-01-19 MED ORDER — ACETAMINOPHEN 10 MG/ML IV SOLN: 1000.0000 mg | Freq: Once | INTRAVENOUS | Status: DC | PRN

## 2018-01-19 MED ORDER — KETAMINE HCL 10 MG/ML IJ SOLN
INTRAMUSCULAR | Status: AC
  Filled 2018-01-19: qty 1

## 2018-01-19 MED ORDER — HYDROMORPHONE HCL 1 MG/ML IJ SOLN
0.2500 mg | INTRAMUSCULAR | Status: DC | PRN
  Administered 2018-01-19 (×4): 0.5 mg via INTRAVENOUS

## 2018-01-19 MED ORDER — ONDANSETRON HCL 4 MG/2ML IJ SOLN
INTRAMUSCULAR | Status: AC
  Filled 2018-01-19: qty 2

## 2018-01-19 MED ORDER — BUPIVACAINE LIPOSOME 1.3 % IJ SUSP: 20.0000 mL | Freq: Once | INTRAMUSCULAR | Status: DC

## 2018-01-19 MED ORDER — SODIUM CHLORIDE 0.9 % IJ SOLN
INTRAMUSCULAR | Status: AC
  Filled 2018-01-19: qty 20

## 2018-01-19 MED ORDER — ACETAMINOPHEN 10 MG/ML IV SOLN
1000.0000 mg | Freq: Four times a day (QID) | INTRAVENOUS | Status: AC
  Administered 2018-01-19 (×3): 1000 mg via INTRAVENOUS
  Filled 2018-01-19 (×3): qty 100

## 2018-01-19 MED ORDER — MEPERIDINE HCL 50 MG/ML IJ SOLN: 6.2500 mg | INTRAMUSCULAR | Status: DC | PRN

## 2018-01-19 MED ORDER — HYDRALAZINE HCL 20 MG/ML IJ SOLN
INTRAMUSCULAR | Status: AC
  Filled 2018-01-19: qty 1

## 2018-01-19 MED ORDER — KETOROLAC TROMETHAMINE 30 MG/ML IJ SOLN
INTRAMUSCULAR | Status: AC
  Filled 2018-01-19: qty 1

## 2018-01-19 SURGICAL SUPPLY — 55 items
CATH FOLEY 2WAY SLVR 18FR 30CC (CATHETERS) ×3 IMPLANT
CATH TIEMANN FOLEY 18FR 5CC (CATHETERS) ×3 IMPLANT
CHLORAPREP W/TINT 26ML (MISCELLANEOUS) ×3 IMPLANT
CLIP VESOLOCK LG 6/CT PURPLE (CLIP) ×6 IMPLANT
COVER SURGICAL LIGHT HANDLE (MISCELLANEOUS) ×3 IMPLANT
COVER TIP SHEARS 8 DVNC (MISCELLANEOUS) ×1 IMPLANT
COVER TIP SHEARS 8MM DA VINCI (MISCELLANEOUS) ×2
CUTTER ECHEON FLEX ENDO 45 340 (ENDOMECHANICALS) ×3 IMPLANT
DECANTER SPIKE VIAL GLASS SM (MISCELLANEOUS) IMPLANT
DERMABOND ADVANCED (GAUZE/BANDAGES/DRESSINGS) ×2
DERMABOND ADVANCED .7 DNX12 (GAUZE/BANDAGES/DRESSINGS) ×1 IMPLANT
DRAPE ARM DVNC X/XI (DISPOSABLE) ×4 IMPLANT
DRAPE COLUMN DVNC XI (DISPOSABLE) ×1 IMPLANT
DRAPE DA VINCI XI ARM (DISPOSABLE) ×8
DRAPE DA VINCI XI COLUMN (DISPOSABLE) ×2
DRAPE SURG IRRIG POUCH 19X23 (DRAPES) ×3 IMPLANT
DRSG TEGADERM 4X4.75 (GAUZE/BANDAGES/DRESSINGS) ×3 IMPLANT
ELECT REM PT RETURN 15FT ADLT (MISCELLANEOUS) ×3 IMPLANT
GAUZE SPONGE 2X2 8PLY STRL LF (GAUZE/BANDAGES/DRESSINGS) IMPLANT
GLOVE BIO SURGEON STRL SZ 6.5 (GLOVE) ×2 IMPLANT
GLOVE BIO SURGEONS STRL SZ 6.5 (GLOVE) ×1
GLOVE BIOGEL M STRL SZ7.5 (GLOVE) ×6 IMPLANT
GOWN STRL REUS W/TWL LRG LVL3 (GOWN DISPOSABLE) ×3 IMPLANT
GOWN STRL REUS W/TWL XL LVL3 (GOWN DISPOSABLE) ×6 IMPLANT
HEMOSTAT SURGICEL 4X8 (HEMOSTASIS) ×3 IMPLANT
HOLDER FOLEY CATH W/STRAP (MISCELLANEOUS) ×3 IMPLANT
IRRIG SUCT STRYKERFLOW 2 WTIP (MISCELLANEOUS) ×3
IRRIGATION SUCT STRKRFLW 2 WTP (MISCELLANEOUS) ×1 IMPLANT
IV LACTATED RINGERS 1000ML (IV SOLUTION) IMPLANT
PACK ROBOT UROLOGY CUSTOM (CUSTOM PROCEDURE TRAY) ×3 IMPLANT
PAD POSITIONING PINK XL (MISCELLANEOUS) ×3 IMPLANT
PORT ACCESS TROCAR AIRSEAL 12 (TROCAR) ×1 IMPLANT
PORT ACCESS TROCAR AIRSEAL 5M (TROCAR) ×2
SCISSORS LAP 5X45 EPIX DISP (ENDOMECHANICALS) ×3 IMPLANT
SEAL CANN UNIV 5-8 DVNC XI (MISCELLANEOUS) ×4 IMPLANT
SEAL XI 5MM-8MM UNIVERSAL (MISCELLANEOUS) ×8
SET TRI-LUMEN FLTR TB AIRSEAL (TUBING) ×3 IMPLANT
SOLUTION ELECTROLUBE (MISCELLANEOUS) ×3 IMPLANT
SPONGE GAUZE 2X2 STER 10/PKG (GAUZE/BANDAGES/DRESSINGS)
SPONGE LAP 4X18 X RAY DECT (DISPOSABLE) ×3 IMPLANT
STAPLE RELOAD 45 GRN (STAPLE) ×1 IMPLANT
STAPLE RELOAD 45MM GREEN (STAPLE) ×2
SUT ETHILON 3 0 PS 1 (SUTURE) ×3 IMPLANT
SUT MNCRL AB 4-0 PS2 18 (SUTURE) ×6 IMPLANT
SUT V-LOC BARB 180 2/0GR6 GS22 (SUTURE) ×3
SUT VIC AB 0 CT1 27 (SUTURE) ×2
SUT VIC AB 0 CT1 27XBRD ANTBC (SUTURE) ×1 IMPLANT
SUT VIC AB 2-0 SH 27 (SUTURE) ×2
SUT VIC AB 2-0 SH 27X BRD (SUTURE) ×1 IMPLANT
SUT VICRYL 0 UR6 27IN ABS (SUTURE) ×9 IMPLANT
SUT VLOC BARB 180 ABS3/0GR12 (SUTURE) ×6
SUTURE V-LC BRB 180 2/0GR6GS22 (SUTURE) ×1 IMPLANT
SUTURE VLOC BRB 180 ABS3/0GR12 (SUTURE) ×2 IMPLANT
TOWEL OR NON WOVEN STRL DISP B (DISPOSABLE) ×3 IMPLANT
WATER STERILE IRR 1000ML POUR (IV SOLUTION) IMPLANT

## 2018-01-19 NOTE — H&P (Signed)
f/u to monitor Prostate Cancer  HPI: Shawn Hicks is a 52 year-old male established patient who is here for interval evaluation of his prostate cancer.  The patient was last seen January 2019.   He was diagnosed with prostate cancer in approximately 08/18/2017. The patient's gleason score was: 3+4=7 (1/12 cores) 3+3=6 (5/12 cores). Pretreatment PSA: 6.49.   This was drawn on approximately 07/20/2017.   He does have problems with erections. Currently he is using Levitra - incomplete erections for his ED. His erections are satisfactory for intercouse.   The patient denies having urinary incontinence. The patient denies any progressive voiding symptoms.   Strong family history of prostate cancer.  71mm nodule on the right lobe.  PSA 6.49  Gleason 3+4=7 1/12 cores (left apex), 3+3=6 5/12 cores b/l apex  After RRP: 78% PFS at 69yr, 63% PFS at 53yr, 6% LNI, 63% ECE   The patient has erectile dysfunction which he has had for some time, and does not respond well to medications. He has minimal voiding symptoms.    Intv: The patient presents today for ongoing discussion for his prostate cancer. With him today he brought his wife. The patient denies any ongoing voiding symptoms. He denies any dysuria. He has not had any hematuria. Otherwise doing quite well.   The patient has no past surgical history.  The patient works as a Furniture conservator/restorer. He has no kids. He is married.      ALLERGIES: No Allergies    MEDICATIONS: Sildenafil 20 mg tablet 2-5 tablets daily as needed  Aspir 81  Atorvastatin Calcium 40 mg tablet  Celery Seed OIL Does Not Apply  Cherry Concentrate Oral Concentrate Oral  Clidamyacin  Fluoxetine Hcl 10 mg tablet  Lisinopril TABS Oral  Omeprazole 20 MG Oral Capsule Delayed Release Oral  Turmeric  Xigduo Xr     GU PSH: Prostate Needle Biopsy - 08/18/2017      PSH Notes: No Surgical Problems   NON-GU PSH: Surgical Pathology, Gross And Microscopic Examination For Prostate  Needle - 08/18/2017    GU PMH: Prostate Cancer - 09/01/2017 ED due to arterial insufficiency (Stable) - 04/24/2017, Erectile dysfunction due to arterial insufficiency, - 2014 Elevated PSA - 04/24/2017, Elevated prostate specific antigen (PSA), - 2014 Prostate nodule w/ LUTS - 04/24/2017 Other microscopic hematuria, Microscopic Hematuria - 2014 Primary hypogonadism, Hypogonadism, testicular - 2014 Prostate, Neoplasm of uncertain behavior, Neoplasm of uncertain behavior of prostate - 2014 Urinary Frequency, Increased urinary frequency - 2014      PMH Notes:  2009-04-24 13:52:06 - Note: Gout   NON-GU PMH: Encounter for general adult medical examination without abnormal findings, Encounter for preventive health examination - 2015 Decreased libido, Libido, Decreased - 2014 Personal history of other diseases of the circulatory system, History of hypertension - 2014 Personal history of other diseases of the digestive system, History of esophageal reflux - 2014 Personal history of other endocrine, nutritional and metabolic disease, History of hypercholesterolemia - 2014 Diabetes Type 2 GERD Gout Hypercholesterolemia Hypertension Sleep Apnea    FAMILY HISTORY: Death In The Family Mother - Runs In Family hepatic failure - Mother Prostate Cancer - Father   SOCIAL HISTORY: Marital Status: Married Preferred Language: English Current Smoking Status: Patient does not smoke anymore.   Tobacco Use Assessment Completed: Used Tobacco in last 30 days? Drinks 2 caffeinated drinks per day. Patient's occupation Actor.     Notes: Current every day smoker, Occupation:, Marital History - Currently Married, Alcohol Use, Caffeine Use, Tobacco Use  REVIEW OF SYSTEMS:    GU Review Male:   Patient reports erection problems. Patient denies frequent urination, hard to postpone urination, burning/ pain with urination, get up at night to urinate, leakage of urine, stream starts and stops,  trouble starting your stream, have to strain to urinate , and penile pain.  Gastrointestinal (Upper):   Patient denies nausea, vomiting, and indigestion/ heartburn.  Gastrointestinal (Lower):   Patient denies diarrhea and constipation.  Constitutional:   Patient denies fever, night sweats, weight loss, and fatigue.  Skin:   Patient denies skin rash/ lesion and itching.  Eyes:   Patient denies blurred vision and double vision.  Ears/ Nose/ Throat:   Patient denies sore throat and sinus problems.  Hematologic/Lymphatic:   Patient denies swollen glands and easy bruising.  Cardiovascular:   Patient denies leg swelling and chest pains.  Respiratory:   Patient denies shortness of breath and cough.  Endocrine:   Patient denies excessive thirst.  Musculoskeletal:   Patient reports joint pain. Patient denies back pain.  Neurological:   Patient denies headaches and dizziness.  Psychologic:   Patient denies depression and anxiety.   Notes: Pt has no urinary symptoms. Emptying bladder well.     VITAL SIGNS:      11/10/2017 02:45 PM  Weight 235 lb / 106.59 kg  Height 69 in / 175.26 cm  BP 110/74 mmHg  Heart Rate 65 /min  Temperature 97.5 F / 36.3 C  BMI 34.7 kg/m   PAST DATA REVIEWED:  Source Of History:  Patient  Lab Test Review:   PSA  Records Review:   Pathology Reports, Previous Doctor Records, Previous Patient Records, POC Tool   04/24/17 01/03/11 11/13/09 04/24/09  PSA  Total PSA 6.49 ng/mL 4.40  3.74  3.72   Free PSA 0.58 ng/mL     % Free PSA 9 % PSA       12/23/10 12/04/09 11/13/09 04/24/09  Hormones  Testosterone, Total 764.09  275.0  136.0  153.0     PROCEDURES:          Urinalysis Dipstick Dipstick Cont'd  Color: Yellow Bilirubin: Neg  Appearance: Clear Ketones: Neg  Specific Gravity: 1.010 Blood: Neg  pH: <=5.0 Protein: Neg  Glucose: 3+ Urobilinogen: 0.2    Nitrites: Neg    Leukocyte Esterase: Neg    ASSESSMENT:      ICD-10 Details  1 GU:   Prostate Cancer -  C61   2   ED due to arterial insufficiency - N52.01      PLAN:           Orders Labs PSA          Document Letter(s):  Created for Patient: Clinical Summary         Notes:   I again discussed the patient's diagnosis of prostate cancer. I went over his Gleason score with him again. We discussed his risk of developing advanced prostate cancer over the next 10 years. We discussed all the treatment options again. Specifically refocused on surgery and radiation. At this point, it appears that he is leaning towards radiation. My plan is at this point to refer him to Dr. Tammi Klippel for further discussion in regards to radiation treatment for prostate cancer. We'll then plan to have the patient contact me following this consultation so that I can facilitate any other specific treatment that he needs.

## 2018-01-19 NOTE — Op Note (Signed)
Preoperative diagnosis:  1. Prostate Cancer   Postoperative diagnosis:  1. same   Procedure: 1. Robotic assisted laparoscopic radical prostatectomy 2. Bilateral pelvic lymph node dissection  Surgeon: Ardis Hughs, MD First Assistant: Debbrah Alar, PA  Anesthesia: General  Complications: None  Intraoperative findings:  #1: bilateral nerve sparing #2:  Endopelvic fascia on the right side was adherent to the wall the prostate and sidewall musculature.  Ultimately, was able to separate the 2 layers, but I did have to cut some of the pelvic sidewall muscle.     EBL: Minimal  Specimens:  #1.  Prostate and seminal vesicals #2.  Bilateral pelvic lymph nodes #3: Left prostatic apex margin #4: Periprosthetic fat  Indication: Shawn Hicks is a 52 y.o. patient with prostate cancer.  After reviewing the management options for treatment, he elected to proceed with the removal of his prostate. We have discussed the potential benefits and risks of the procedure, side effects of the proposed treatment, the likelihood of the patient achieving the goals of the procedure, and any potential problems that might occur during the procedure or recuperation. Informed consent has been obtained.  Description of procedure:  The patient was consented in the preoperative holding area. He was in brought back to the operating room placed the table in supine position. General anesthesia was then induced and endotracheal tube was inserted. He was then placed in dorsolithotomy position and placed in steep Trendelenburg. He was then prepped and draped in the routine sterile fashion. We, the first assistant and I, then began by making a 10 mm incision supraumbilical midline incision the skin down through into the peritoneum. Then placed a 8 mm trocar. I then inflated the abdomen and inserted the 0 robotic lens. We then placed 2 additional a 8 millimeter trochars in the patient's left lower abdomen proximally  9 cm apart and 2 trochars on the patient's right lower abdomen, one was an 8 mm trocar and the one most lateral was a 12 mm trocar which was used as the assistant port. A 5 mm trocar was placed by triangulating the 2 right lateral ports as a second assistant port. These ports were all placed under visual guidance. Once the ports were noted to be satisfactory position the robot was docked. We started with the 0 lens, monopolar scissors in the right hand and the [place] forceps the left hand as well as a fenestrated grasper as the third arm on the left-hand side.   We, the first assistant and I,  began our dissection the posterior plane incising the peritoneum at the level of the vas deferens. Isolated the left vas deferens and dissected it proximally towards the spermatic cord for 5 cm prior to ligating it. Then used this as traction to isolate the left the seminal vesicle which was then undressed bluntly and completely dissected out, all vessels were cauterized with a combination of bipolar and the monopolar scissors. We then turned our attention to the right side and similarly dissected out the right vas deferens and seminal vesicle. Once the SVs had been freed, we turned our attention to the posterior plane and bluntly dissected the tissue between the rectum and the posterior wall of the prostate bluntly out towards the apex.    At this point the bladder was taken down starting at the urachal remnant with a combination of both blunt dissection and sharp dissection using monopolar cautery the bladder was dropped down in the usual fashion to the medial umbilical ligaments  laterally and the dorsal vein of the prostate anteriorly creating our space of Retzius. We then turned our attention to the endopelvic fascia which was incised laterally starting on the patient's right-hand side the levator muscles were pushed off the prostate laterally up towards the dorsal vein complex on the right-hand side. This process  was then repeated on the left-hand side and a nice notch was created for the dorsal vein. I then used a 73mm stapler to staple the dorsal vein.   We,the first assistant and I, then located the bladder neck at the vesicoprostatic junction and using the monopolar scissors dissected down through the perivesical tissues and the bladder neck down to the prostatic urethra. The catheter was then deflated and pulled through our urethral opening and then used to retract the prostate anteriorly for the posterior bladder neck dissection. Once through the bladder neck and into the posterior plane of the prostate, the SVs were brought through the opening. The left pedicle was then isolated and systematically ligated with Weck clips and scissors. The nerve bundle was then peeled off the posterior lateral aspect of the prostate and bluntly dissected away off the prostate. This was then repeated on the right side.    I then came down through the dorsal venous complex anteriorly down to the membranous urethra using the monopolar. Once down to the urethra, it was transected sharply and the apex of the prostate was then dissected off the levator and rectourethralis muscles. Once the apex of the prostate had been dissected free we came back to the base of the prostate and bluntly push the rectum and nerve vascular bundle off the prostate the patient's left and used clips on the patient's right to free the prostate. Once the prostate was free it was placed off to the side. The pelvis was then irrigated with normal saline and noted to be relatively hemostatic.  Attention was then turned to the right pelvic sidewall. The fibrofatty tissue between the external iliac vein, confluence of the iliac vessels, hypogastric artery, and Cooper's ligament was dissected free from the pelvic sidewall with care to preserve the obturator nerve. Weck clips were used for lymphostasis and hemostasis. An identical procedure was performed on the  contralateral side and the lymphatic packets were removed for permanent pathologic analysis.  The prostate and both lymph node tissues were placed in the Endo Catch bag and the string brought to the 5 mm port.    The vesicourethral anastomosis was then completed with 2 interlocking 3-0 V. lock sutures running the anastomosis in the 6:00 position to the 12:00 position on each side and then tying it off on the top. The final catheter was then passed through the patient's urethra and into the bladder and 120 cc was instilled into the bladder to test the anastomosis. As there was no leak a 59 [place] Blake drain was passed through the left lateral port and placed around the vesicourethral anastomosis. A 12 mm assistant port on the right lateral side was then closed with 0 Vicryl with the help of the [employer] needle. The 12 mm midline infraumbilical incision was then extended another centimeter taken down and the fascia opened to remove the Endo Catch bag with the prostate specimen. The fascia was then closed with a 0 Vicryl and all skin ports were closed with 4-0 Monocryl in a subcutaneous fashion. Dermabond glue was then applied to the incisions. The drain was then secured to the skin with a 0 nylon stitch and dressing  applied.   At the end of the case all laps needles and sponges had been accounted for. There no immediate complications. The patient returned to the PACU in stable condition.

## 2018-01-19 NOTE — Anesthesia Postprocedure Evaluation (Signed)
Anesthesia Post Note  Patient: Shawn Hicks  Procedure(s) Performed: XI ROBOTIC ASSISTED LAPAROSCOPIC RADICAL PROSTATECTOMY (N/A ) BILATERAL LYMPHADENECTOMY (N/A )     Patient location during evaluation: PACU Anesthesia Type: General Level of consciousness: awake and alert Pain management: pain level controlled Vital Signs Assessment: post-procedure vital signs reviewed and stable Respiratory status: spontaneous breathing, nonlabored ventilation, respiratory function stable and patient connected to nasal cannula oxygen Cardiovascular status: blood pressure returned to baseline and stable Postop Assessment: no apparent nausea or vomiting Anesthetic complications: no    Last Vitals:  Vitals:   01/19/18 1315 01/19/18 1330  BP: 138/88   Pulse: 91   Resp: 10   Temp:    SpO2: 96% 97%    Last Pain:  Vitals:   01/19/18 1315  TempSrc:   PainSc: 7                  Barnet Glasgow

## 2018-01-19 NOTE — Transfer of Care (Signed)
Immediate Anesthesia Transfer of Care Note  Patient: Shawn Hicks  Procedure(s) Performed: XI ROBOTIC ASSISTED LAPAROSCOPIC RADICAL PROSTATECTOMY (N/A ) BILATERAL LYMPHADENECTOMY (N/A )  Patient Location: PACU  Anesthesia Type:General  Level of Consciousness: drowsy and patient cooperative  Airway & Oxygen Therapy: Patient Spontanous Breathing and Patient connected to face mask oxygen  Post-op Assessment: Report given to RN and Post -op Vital signs reviewed and stable  Post vital signs: Reviewed and stable  Last Vitals:  Vitals Value Taken Time  BP 167/100 01/19/2018 12:33 PM  Temp    Pulse 95 01/19/2018 12:34 PM  Resp 22 01/19/2018 12:33 PM  SpO2 100 % 01/19/2018 12:34 PM  Vitals shown include unvalidated device data.  Last Pain:  Vitals:   01/19/18 0618  TempSrc:   PainSc: 0-No pain         Complications: No apparent anesthesia complications

## 2018-01-19 NOTE — Discharge Instructions (Signed)

## 2018-01-19 NOTE — Anesthesia Procedure Notes (Signed)
Procedure Name: Intubation Date/Time: 01/19/2018 7:37 AM Performed by: Genelle Bal, CRNA Pre-anesthesia Checklist: Patient identified, Emergency Drugs available, Suction available and Patient being monitored Patient Re-evaluated:Patient Re-evaluated prior to induction Oxygen Delivery Method: Circle system utilized Preoxygenation: Pre-oxygenation with 100% oxygen Induction Type: IV induction Ventilation: Mask ventilation without difficulty Laryngoscope Size: Miller and 2 Grade View: Grade I Tube type: Oral Tube size: 7.5 mm Number of attempts: 1 Airway Equipment and Method: Stylet and Oral airway Placement Confirmation: ETT inserted through vocal cords under direct vision,  positive ETCO2 and breath sounds checked- equal and bilateral Secured at: 22 cm Tube secured with: Tape Dental Injury: Teeth and Oropharynx as per pre-operative assessment

## 2018-01-20 ENCOUNTER — Encounter (HOSPITAL_COMMUNITY): Payer: Self-pay | Admitting: Urology

## 2018-01-20 DIAGNOSIS — C61 Malignant neoplasm of prostate: Secondary | ICD-10-CM | POA: Diagnosis not present

## 2018-01-20 LAB — CBC
HEMATOCRIT: 37.3 % — AB (ref 39.0–52.0)
HEMOGLOBIN: 12.3 g/dL — AB (ref 13.0–17.0)
MCH: 30.4 pg (ref 26.0–34.0)
MCHC: 33 g/dL (ref 30.0–36.0)
MCV: 92.1 fL (ref 78.0–100.0)
Platelets: 210 10*3/uL (ref 150–400)
RBC: 4.05 MIL/uL — ABNORMAL LOW (ref 4.22–5.81)
RDW: 14 % (ref 11.5–15.5)
WBC: 12 10*3/uL — AB (ref 4.0–10.5)

## 2018-01-20 LAB — BASIC METABOLIC PANEL
Anion gap: 8 (ref 5–15)
BUN: 21 mg/dL — AB (ref 6–20)
CHLORIDE: 103 mmol/L (ref 101–111)
CO2: 22 mmol/L (ref 22–32)
Calcium: 8.7 mg/dL — ABNORMAL LOW (ref 8.9–10.3)
Creatinine, Ser: 1.09 mg/dL (ref 0.61–1.24)
GFR calc Af Amer: >60 mL/min (ref >60)
GLUCOSE: 125 mg/dL — AB (ref 65–99)
POTASSIUM: 4.6 mmol/L (ref 3.5–5.1)
Sodium: 133 mmol/L — ABNORMAL LOW (ref 135–145)

## 2018-01-20 LAB — GLUCOSE, CAPILLARY
Glucose-Capillary: 112 mg/dL — ABNORMAL HIGH (ref 65–99)
Glucose-Capillary: 165 mg/dL — ABNORMAL HIGH (ref 65–99)

## 2018-01-20 LAB — CREATININE, FLUID (PLEURAL, PERITONEAL, JP DRAINAGE): Creat, Fluid: 1.3 mg/dL

## 2018-01-20 NOTE — Discharge Summary (Signed)
Physician Discharge Summary      Patient ID: Shawn Hicks MRN: [id number] DOB/AGE: 1966-04-21 52 y.o.  Admit date: 01/19/2018 Discharge date: 01/20/2018  Admission Diagnoses: PROSTATE CANCER  Discharge Diagnoses:  Active Problems:   Prostate cancer [facility])   Discharged Condition: good  Hospital Course: He was admitted for elective radical prostatectomy which was performed robotically without complication.  He was observed overnight and has done well.  He currently has some slight discomfort in the right lower quadrant but is free of any mass or ecchymoses.  His port sites look good.  His urine is clear.  He is tolerating a regular diet without nausea or vomiting.  His JP drain output is moderate and therefore will be sent for creatinine prior to his discharge.  We discussed removal of his drain if his creatinine returns consistent with serum however if elevated he will be discharged with his drain in place.  Discharge medications were prescribed and his follow-up is on the chart.  Significant Diagnostic Studies: No results found.  Discharge Exam: Blood pressure 117/69, pulse (!) 55, temperature 98.1 F (36.7 C), temperature source Oral, resp. rate 18, height 5\' 9"  (1.753 m), weight 98 kg (216 lb), SpO2 99 %. General: Alert, oriented in no apparent distress Chest: Normal respiratory effort Cardiovascular: Regular rate and rhythm Abdomen: Soft, slightly tender in the right lower quadrant with no mass or peritoneal signs.  Port sites free of discharge, ecchymoses or erythema.  Disposition: He will be discharged home once the results of his drain creatinine return.   Allergies as of 01/20/2018   No Known Allergies     Medication List    STOP taking these medications   ibuprofen 200 MG tablet Commonly known as:  ADVIL,MOTRIN     TAKE these medications   atorvastatin 40 MG tablet Commonly known as:  LIPITOR Take 40 mg by mouth every evening.   BLACK CHERRY CONCENTRATE  PO Take 1 tablet by mouth every evening.   colchicine 0.6 MG tablet Take 0.6 mg by mouth daily as needed (gout).   diclofenac sodium 1 % Gel Commonly known as:  VOLTAREN Apply 1 application topically 2 (two) times daily as needed (pain).   docusate sodium 100 MG capsule Commonly known as:  COLACE Take 1 capsule (100 mg total) by mouth 2 (two) times daily.   FLUoxetine 10 MG tablet Commonly known as:  PROZAC Take 10 mg by mouth daily.   lisinopril 40 MG tablet Commonly known as:  PRINIVIL,ZESTRIL Take 20 mg by mouth every evening.   neomycin-bacitracin-polymyxin ointment Commonly known as:  NEOSPORIN Apply 1 application topically as needed for wound care.   nitroGLYCERIN 0.4 MG SL tablet Commonly known as:  NITROSTAT Place 1 tablet (0.4 mg total) under the tongue every 5 (five) minutes as needed for chest pain.   omeprazole 20 MG capsule Commonly known as:  PRILOSEC Take 20 mg by mouth every evening.   sitaGLIPtin 100 MG tablet Commonly known as:  JANUVIA Take 100 mg by mouth daily.   sulfamethoxazole-trimethoprim 800-160 MG tablet Commonly known as:  BACTRIM DS,SEPTRA DS Take 1 tablet by mouth 2 (two) times daily. Start taking one day prior to your appointment for your first follow-up and catheter removal.  Continue taking for three days.   traMADol 50 MG tablet Commonly known as:  ULTRAM Take 1-2 tablets (50-100 mg total) by mouth every 6 (six) hours as needed for moderate pain.   TURMERIC PO Take 1 capsule by mouth every  evening.   XIGDUO XR 06-999 MG Tb24 Generic drug:  Dapagliflozin-metFORMIN HCl ER Take 1 tablet by mouth every evening.      Follow-up Information    Ardis Hughs, MD On 01/26/2018.   Specialty:  Urology Why:  2:15pm, Catheter removal Contact information: [place] [address] [phone]           Signed: Claybon Jabs 01/20/2018, 6:54 AM

## 2018-01-20 NOTE — Progress Notes (Signed)
Patient is stable for discharge. Discharge instructions and medications have been reviewed with the patient and his wife and all questions answered. AVS and prescriptions given to patient. Provided patient and his wife with foley/leg bag teaching. They both verbalized/demonstrated understanding. Provided them with the robotic prostatectomy book as well.   Othella Boyer [facility] 01/20/2018

## 2018-02-01 ENCOUNTER — Ambulatory Visit: Payer: 59 | Attending: Urology | Admitting: Physical Therapy

## 2018-02-01 ENCOUNTER — Encounter: Payer: Self-pay | Admitting: Physical Therapy

## 2018-02-01 ENCOUNTER — Other Ambulatory Visit: Payer: Self-pay

## 2018-02-01 DIAGNOSIS — M6281 Muscle weakness (generalized): Secondary | ICD-10-CM | POA: Insufficient documentation

## 2018-02-01 DIAGNOSIS — Z483 Aftercare following surgery for neoplasm: Secondary | ICD-10-CM | POA: Diagnosis present

## 2018-02-01 DIAGNOSIS — C61 Malignant neoplasm of prostate: Secondary | ICD-10-CM | POA: Diagnosis present

## 2018-02-01 DIAGNOSIS — R279 Unspecified lack of coordination: Secondary | ICD-10-CM | POA: Diagnosis present

## 2018-02-01 NOTE — Patient Instructions (Addendum)
About Abdominal Massage  Abdominal massage, also called external colon massage, is a self-treatment circular massage technique that can reduce and eliminate gas and ease constipation. The colon naturally contracts in waves in a clockwise direction starting from inside the right hip, moving up toward the ribs, across the belly, and down inside the left hip.  When you perform circular abdominal massage, you help stimulate your colon's normal wave pattern of movement called peristalsis.  It is most beneficial when done after eating.  Positioning You can practice abdominal massage with oil while lying down, or in the shower with soap.  Some people find that it is just as effective to do the massage through clothing while sitting or standing.  How to Massage Start by placing your finger tips or knuckles on your right side, just inside your hip bone.  . Make small circular movements while you move upward toward your rib cage.   . Once you reach the bottom right side of your rib cage, take your circular movements across to the left side of the bottom of your rib cage.  . Next, move downward until you reach the inside of your left hip bone.  This is the path your feces travel in your colon. . Continue to perform your abdominal massage in this pattern for 10 minutes each day.     You can apply as much pressure as is comfortable in your massage.  Start gently and build pressure as you continue to practice.  Notice any areas of pain as you massage; areas of slight pain may be relieved as you massage, but if you have areas of significant or intense pain, consult with your healthcare provider.  Other Considerations . General physical activity including bending and stretching can have a beneficial massage-like effect on the colon.  Deep breathing can also stimulate the colon because breathing deeply activates the same nervous system that supplies the colon.   . Abdominal massage should always be used in  combination with a bowel-conscious diet that is high in the proper type of fiber for you, fluids (primarily water), and a regular exercise program. Scar Massage  Scar massage is done to improve the mobility of scar, decrease scar tissue from building up, reduce adhesions, and prevent Keloids from forming. Start scar massage after scabs have fallen off by themselves and no open areas. The first few weeks after surgery, it is normal for a scar to appear pink or red and slightly raised. Scars can itch or have areas of numbness. Some scars may be sensitive.   Direct Scar massage: after scar is healed, no opening, no scab 1.  Place pads of two fingers together directly on the scar starting at one end of the scar. Move the fingers up and down across the scar holding 5 seconds one direction.  Then go opposite direction hold 5 seconds.  2. Move over to the next section of the scar and repeat.  Work your way along the entire length of the scar.   3. Next make diagonal movements along the scar holding 5 seconds at one direction. 4. Next movement is side to side. 5. Do not rub fingers over the scar.  Instead keep firm pressure and move scar over the tissue it is on top   Scar Lift and Roll 12 weeks after surgery. 1. Pinch a small amount of the scar between your first two fingers and thumb.  2. Roll the scar between your fingers for 5 to 15 seconds. 3.  Move along the scar and repeat until you have massaged the entire length of scar.   Stop the massage and call your doctor if you notice: 1. Increased redness 2. Bleeding from scar 3. Seepage coming from the scar 4. Scar is warmer and has increased pain   Isometric Hold (Hook-Lying)    Lie with hips and knees bent. Slowly inhale, and then exhale. Pull navel toward spine and Hold for __5_ seconds. Continue to breathe in and out during hold. Rest for __5_ seconds. Repeat _10__ times. Do __3_ times a day. Practice in sitting  Copyright  VHI. All rights  reserved.  Bracing With Leg Lift (Prone)    With pillow support, lie on abdomen and neutral spine, tighten pelvic floor and abdominals and hold. Alternately raise legs off floor. Repeat _10__ times. Do _1__ times a day.   Copyright  VHI. All rights reserved.  Massage below the scrotum in circular motion for 5 minutes.   Stroke the penis to improve muscles and begin process of erection for 2 min  Toileting Techniques for Bowel Movements (Defecation) Using your belly (abdomen) and pelvic floor muscles to have a bowel movement is usually instinctive.  Sometimes people can have problems with these muscles and have to relearn proper defecation (emptying) techniques.  If you have weakness in your muscles, organs that are falling out, decreased sensation in your pelvis, or ignore your urge to go, you may find yourself straining to have a bowel movement.  You are straining if you are: . holding your breath or taking in a huge gulp of air and holding it  . keeping your lips and jaw tensed and closed tightly . turning red in the face because of excessive pushing or forcing . developing or worsening your  hemorrhoids . getting faint while pushing . not emptying completely and have to defecate many times a day  If you are straining, you are actually making it harder for yourself to have a bowel movement.  Many people find they are pulling up with the pelvic floor muscles and closing off instead of opening the anus. Due to lack pelvic floor relaxation and coordination the abdominal muscles, one has to work harder to push the feces out.  Many people have never been taught how to defecate efficiently and effectively.  Notice what happens to your body when you are having a bowel movement.  While you are sitting on the toilet pay attention to the following areas: . Jaw and mouth position . Angle of your hips   . Whether your feet touch the ground or not . Arm placement  . Spine  position . Waist . Belly tension . Anus (opening of the anal canal)  An Evacuation/Defecation Plan   Here are the 4 basic points:  1. Lean forward enough for your elbows to rest on your knees 2. Support your feet on the floor or use a low stool if your feet don't touch the floor  3. Push out your belly as if you have swallowed a beach ball-you should feel a widening of your waist 4. Open and relax your pelvic floor muscles, rather than tightening around the anus      The following conditions my require modifications to your toileting posture:  . If you have had surgery in the past that limits your back, hip, pelvic, knee or ankle flexibility . Constipation   Your healthcare practitioner may make the following additional suggestions and adjustments:  1) Sit on the toilet  a) Make sure your feet are supported. b) Notice your hip angle and spine position-most people find it effective to lean forward or raise their knees, which can help the muscles around the anus to relax  c) When you lean forward, place your forearms on your thighs for support  2) Relax suggestions a) Breath deeply in through your nose and out slowly through your mouth as if you are smelling the flowers and blowing out the candles. b) To become aware of how to relax your muscles, contracting and releasing muscles can be helpful.  Pull your pelvic floor muscles in tightly by using the image of holding back gas, or closing around the anus (visualize making a circle smaller) and lifting the anus up and in.  Then release the muscles and your anus should drop down and feel open. Repeat 5 times ending with the feeling of relaxation. c) Keep your pelvic floor muscles relaxed; let your belly bulge out. d) The digestive tract starts at the mouth and ends at the anal opening, so be sure to relax both ends of the tube.  Place your tongue on the roof of your mouth with your teeth separated.  This helps relax your mouth and will help  to relax the anus at the same time.  3) Empty (defecation) a) Keep your pelvic floor and sphincter relaxed, then bulge your anal muscles.  Make the anal opening wide.  b) Stick your belly out as if you have swallowed a beach ball. c) Make your belly wall hard using your belly muscles while continuing to breathe. Doing this makes it easier to open your anus. d) Breath out and give a grunt (or try using other sounds such as ahhhh, shhhhh, ohhhh or grrrrrrr).  4) Finish a) As you finish your bowel movement, pull the pelvic floor muscles up and in.  This will leave your anus in the proper place rather than remaining pushed out and down. If you leave your anus pushed out and down, it will start to feel as though that is normal and give you incorrect signals about needing to have a bowel movement.    Earlie Counts, PT [facility] [address] [place], [place] [address] Quick Contraction: Gravity Resisted (Sitting)    Sitting, quickly squeeze then fully relax pelvic floor. Perform __1_ sets of _5__. Rest for __1_ seconds between sets. Do _5__ times a day.  Copyright  VHI. All rights reserved.  Slow Contraction: Gravity Resisted (Sitting)    Sitting, slowly squeeze pelvic floor for _5__ seconds. Rest for _5__ seconds. Repeat __5_ times. Do __5_ times a day.  Copyright  VHI. All rights reserved.  [facility] [address], [place], [place] [address] Phone # [phone] Fax [phone]

## 2018-02-01 NOTE — Therapy (Signed)
[facility] [address] [place], [place], [address] Phone: [phone]   Fax:  [phone]  Physical Therapy Evaluation  Patient Details  Name: Shawn Hicks MRN: [id number] Date of Birth: 09-Mar-1966 Referring Provider: Dr. Ardis Hughs   Encounter Date: 02/01/2018  PT End of Session - 02/01/18 1423    Visit Number  1    Date for PT Re-Evaluation  04/26/18    Authorization Type  [employer]    PT Start Time  0800    PT Stop Time  0845    PT Time Calculation (min)  45 min    Activity Tolerance  Patient tolerated treatment well    Behavior During Therapy  [facility] for tasks assessed/performed       Past Medical History:  Diagnosis Date  . Back pain   . Bilateral medial epicondylitis of elbow joint   . Boil    under arm right  . Chest pain   . Depression   . Diabetes mellitus without complication ([place])   . ED (erectile dysfunction)   . Elevated PSA   . GERD (gastroesophageal reflux disease)   . Gout   . Hyperlipidemia   . Hypertension   . Migraine    Occular  . Obesity   . OSA on CPAP   . Palpitations   . Pneumonia    Hospitalized childhood  . Prostate cancer ([place])   . Tobacco abuse     Past Surgical History:  Procedure Laterality Date  . DENTAL SURGERY     Wisdom teeth  . LYMPHADENECTOMY N/A 01/19/2018   Procedure: BILATERAL LYMPHADENECTOMY;  Surgeon: Ardis Hughs, MD;  Location: [facility];  Service: Urology;  Laterality: N/A;  . PROSTATE BIOPSY    . ROBOT ASSISTED LAPAROSCOPIC RADICAL PROSTATECTOMY N/A 01/19/2018   Procedure: XI ROBOTIC ASSISTED LAPAROSCOPIC RADICAL PROSTATECTOMY;  Surgeon: Ardis Hughs, MD;  Location: [facility];  Service: Urology;  Laterality: N/A;    There were no vitals filed for this visit.   Subjective Assessment - 02/01/18 0812    Subjective  Patient had prostate surgery on 01/19/2018.  Patient reports pain and no control of bladder.     Patient Stated Goals   decrease pain and decrease urinary leakage    Currently in Pain?  Yes    Pain Score  5     Pain Location  Abdomen    Pain Orientation  Right    Pain Descriptors / Indicators  Shooting    Pain Type  Surgical pain    Pain Radiating Towards  goes to penis    Pain Onset  1 to 4 weeks ago    Pain Frequency  Intermittent    Aggravating Factors   sitting on toilet, bending over, on feet for awhile,     Pain Relieving Factors  hold right lower abdomen in    Multiple Pain Sites  No         [facility] PT Assessment - 02/01/18 0001      Assessment   Medical Diagnosis  C61Prostate cancer    Referring Provider  Dr. Ardis Hughs    Onset Date/Surgical Date  01/19/18    Prior Therapy  None      Precautions   Precautions  Other (comment)    Precaution Comments  cancer      Restrictions   Weight Bearing Restrictions  No      Balance Screen   Has the patient fallen in  the past 6 months  No    Has the patient had a decrease in activity level because of a fear of falling?   No    Is the patient reluctant to leave their home because of a fear of falling?   No      Home Film/video editor residence      Prior Function   Level of Independence  Independent    Vocation  Full time employment    Vocation Requirements  standing, lifting, climbing, on knees    Leisure  walking      Cognition   Overall Cognitive Status  Within Functional Limits for tasks assessed      Posture/Postural Control   Posture/Postural Control  No significant limitations      ROM / Strength   AROM / PROM / Strength  AROM;PROM;Strength      AROM   Overall AROM   Within functional limits for tasks performed      PROM   Overall PROM   Within functional limits for tasks performed      Strength   Right Hip Extension  3+/5    Right Hip ABduction  4/5    Left Hip Extension  3+/5    Left Hip ABduction  4/5                Objective measurements completed on examination: See  above findings.    Pelvic Floor Special Questions - 02/01/18 0001    Currently Sexually Active  No    Urinary Leakage  Yes    How often  all day    Pad use  changes every 2-3 hours    Activities that cause leaking  With strong urge;Coughing;Sneezing;Laughing;Lifting;Bending;Walking;Running;Exercising    Urinary urgency  Yes    Urinary frequency  2-3 times at night, every 2 hours    Exam Type  Deferred       [facility] Adult PT Treatment/Exercise - 02/01/18 0001      Self-Care   Self-Care  Other Self-Care Comments    Other Self-Care Comments   scar massage, abdominal massage for bowel movement, information on penile clamp and articuff      Therapeutic Activites    Therapeutic Activities  Other Therapeutic Activities    Other Therapeutic Activities  abdominal bracing with turning in bed, going from sit to stand and back, lifting, correct toileting technique      Neuro Re-ed    Neuro Re-ed Details   pelvic floor contraction, quick flicks,              PT Education - 02/01/18 1422    Education provided  Yes    Education Details  toileting technique, abdominal massage, pelvic floor contraction, how to massage the perineum, scar massage, hip extension    Person(s) Educated  Patient    Methods  Explanation;Demonstration;Verbal cues;Handout    Comprehension  Returned demonstration;Verbalized understanding       PT Short Term Goals - 02/01/18 1434      PT SHORT TERM GOAL #1   Title  understand how to perform scar massage to reduce pain    Time  4    Period  Weeks    Status  New    Target Date  03/01/18      PT SHORT TERM GOAL #2   Title  ability to perform a pelvic floor contraction to work on decreasing reduction of urinary leakage    Time  4  Period  Weeks    Status  New    Target Date  03/01/18      PT SHORT TERM GOAL #3   Title  understand ways to reduce urinary leakage with penile clamp and articuff    Time  4    Period  Weeks    Status  New    Target Date   03/01/18      PT SHORT TERM GOAL #4   Title  understand correct toileting technique to reduce strain on the pelvic floor    Time  4    Period  Weeks    Status  New    Target Date  03/01/18        PT Long Term Goals - 02/01/18 1437      PT LONG TERM GOAL #1   Title  independent with HEP and understand how to progress his program    Time  12    Period  Weeks    Status  New    Target Date  04/26/18      PT LONG TERM GOAL #2   Title  ability to hold a pelvic floor contraction for 10 seconds to reduce urinary leakage >/= 75%    Time  12    Period  Weeks    Status  New    Target Date  04/26/18      PT LONG TERM GOAL #3   Title  urinary leakage during the night decreased >/= 75% due to improve strength and endurance of pelvic floor muscles    Time  12    Period  Weeks    Status  New    Target Date  04/26/18      PT LONG TERM GOAL #4   Title  ability to go from sit to  stand with urinary leakage decreased </- 50% due to not holding breath and pushing on the pelvic floor contraction    Time  12    Period  Weeks    Status  New    Target Date  04/26/18             Plan - 02/01/18 1423    Clinical Impression Statement  Patient is a 52 year old male s/p prostectomy with nerve sparring on 01/19/2018.  Patient reports pain level is 5/10 in the right lower abdomen and saddle area.  Patient reports constant urinary leakage and changes his depende every 2-3 hours.  Patient will wake up 2-3 times per night to go to the bathroom and change his depends.  Patient scars on hsi abdomen and healing and the ones on the right side of his abdomen is thick and tender.  Patient does not know how to brace his abdominals with transitional movements, lifting and turning in bed increeasing his abdominal hernia and pain.  Bilateral hip extension and abduction are weak. Patient has a high co-pay making it difficult to attend Pt frequently. Patient will benefi tfrom skilled therapy to educate patient on  HEP for pelvic floor strength, how to take care of surgical istes, core strength and how to progress himself with exercises.     History and Personal Factors relevant to plan of care:  s/p prostectomy 01/19/2018; Diabetic; Back pain, Hypertension    Clinical Presentation  Evolving    Clinical Presentation due to:  leakage of urine is nonstop making it difficulty to go out and not able to work due to just having surgery    Clinical Decision  Making  Moderate    Rehab Potential  Excellent    Clinical Impairments Affecting Rehab Potential  s/p prostectomy 01/19/2018; Diabetic; Back pain, Hypertension    PT Frequency  1x / week    PT Duration  12 weeks    PT Treatment/Interventions  Biofeedback;Moist Heat;Therapeutic exercise;Therapeutic activities;Functional mobility training;Neuromuscular re-education;Patient/family education;Scar mobilization;Manual techniques;Passive range of motion;Energy conservation    PT Next Visit Plan  hip stretches, pelvic floor contraction; core strength with extremity movement    Consulted and Agree with Plan of Care  Patient       Patient will benefit from skilled therapeutic intervention in order to improve the following deficits and impairments:  Pain, Decreased strength, Decreased scar mobility, Decreased coordination  Visit Diagnosis: Muscle weakness (generalized) - Plan: PT plan of care cert/re-cert  Unspecified lack of coordination - Plan: PT plan of care cert/re-cert  Aftercare following surgery for neoplasm - Plan: PT plan of care cert/re-cert  Prostate cancer [facility]) - Plan: PT plan of care cert/re-cert     Problem List Patient Active Problem List   Diagnosis Date Noted  . Prostate cancer ([place]) 01/19/2018  . Chest pain, rule out acute myocardial infarction 01/16/2018  . HLD (hyperlipidemia) 01/16/2018  . Malignant neoplasm of prostate ([place]) 12/12/2017  . 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 Snorings 11/10/2014  . Severe obesity (BMI >= 40) ([place]) 11/10/2014  . Excessive daytime sleepiness 11/10/2014  . Sleep disorder, circadian, shift work type 11/10/2014  . Hypertension   . Chest pain 08/27/2014  . Tobacco abuse 08/27/2014  . Type 2 diabetes mellitus ([place]) 08/27/2014  . Essential hypertension 08/27/2014  . Palpitations 07/31/2013  . Obesity 07/31/2013  . Hyposmolality and/or hyponatremia 07/31/2013    Earlie Counts, PT 02/01/18 2:43 PM    [facility] [address] [place], [place], [address] Phone: [phone]   Fax:  [phone]  Name: Shawn Hicks MRN: [id number] Date of Birth: 1965/10/14

## 2018-02-08 ENCOUNTER — Encounter: Payer: Self-pay | Admitting: Physical Therapy

## 2018-02-08 ENCOUNTER — Ambulatory Visit: Payer: 59 | Admitting: Physical Therapy

## 2018-02-08 DIAGNOSIS — C61 Malignant neoplasm of prostate: Secondary | ICD-10-CM

## 2018-02-08 DIAGNOSIS — R279 Unspecified lack of coordination: Secondary | ICD-10-CM

## 2018-02-08 DIAGNOSIS — M6281 Muscle weakness (generalized): Secondary | ICD-10-CM | POA: Diagnosis not present

## 2018-02-08 DIAGNOSIS — Z483 Aftercare following surgery for neoplasm: Secondary | ICD-10-CM

## 2018-02-08 NOTE — Patient Instructions (Addendum)
Penile Pump Protocol Prior to using the pump retract the foreskin of the penis and shave the pubic hair Prior to placing pump on penis, retract the foreskin and shave the pubic hair Place water based lubricant only on the ring.  Place pump over the penis to the base.  Use slow gentle compressions till erection Maintain erection for 5 seconds then release, So for 10-20 erections for 10 min per day for 4 weeks Then progress to  3 minutes on, 1 minute off, 3 repetitions of this sequence for 3 times per week up to 12 months For post-prostatectomy, use as early as 4 weeks post-operatively, up to 12 months  Device may be covered by insurance if billed for "penile rehabilitation"  The device is to stretch and increase strength of penis You Tube vides "Physiotherapy Penile Rehab for Erectile Dysfunction ( use of vacuum pump)  Types of devices Penile pumps  1. Vacurect Vacuum Therapy Erectile Dysfunction Device  by Vacurect Can get on [employer] .  2. Active Erection System StatMob.pl   Intimacy and impotence by [place] you sex life by Loel Dubonnet  Prostate Recovery Map Paperback - 2014  by Larene Pickett (Author)      Butterfly, Supine    Lie on back, feet together. Lower knees toward floor. Hold __30_ seconds. Repeat _2__ times per session. Do __1_ sessions per day.  Copyright  VHI. All rights reserved.  Bracing With Bridging (Hook-Lying)    With neutral spine, tighten pelvic floor and abdominals and hold. Lift bottom. Repeat _10__ times. Do _2__ times a day.   Copyright  VHI. All rights reserved.   Hamstring: Towel Stretch (Supine)    Lie on back. Loop towel around left foot, hip and knee at 90. Straighten knee and pull foot toward body. Hold _30__ seconds. Relax. Repeat _2__ times. Do __1_ times a day. Repeat with other leg.    Copyright  VHI. All rights reserved.  Posterior Hip (Supine)    Lie on back. Cross right ankle on  other thigh. Slide other foot on surface toward buttocks until stretch is felt in back of hip. Hold __30_ seconds. Relax. Repeat _2__ times. Do _1__ times a day. Repeat with legs switched.    Copyright  VHI. All rights reserved.   Certain foods and liquids will decrease the pH making the urine more acidic.  Urinary urgency increases when the urine has a low pH.  Most common irritants: alcohol, carbonated beverages and caffinated beverages.  Foods to avoid: apple juice, apples, ascorbic acid, canteloupes, chili, citrus fruits, coffee, cranberries, grapes, guava, peaches, pepper, pineapple, plums, strawberries, tea, tomatoes, and vinegar.  Drinking plenty of water may help to increase the pH and dilute out any of the effects of specific irritants.  Foods that are NOT irritating to the bladder include: Pears, papayas, sun-brewed teas, watermelons, non-citrus herbal teas, apricots, kava and low-acid instant drinks ([place])  Adduction: Hip - Knees Together With Pelvic Floor (Hook-Lying)    Lie with hips and knees bent, towel roll between knees. Squeeze pelvic floor while pushing knees together. Hold for _10__ seconds. Rest for _5__ seconds. Repeat __10_ times. Do __3_ times a day.   Copyright  VHI. All rights reserved.   [facility] [address] [place], [place] [address] Phone # [phone] Fax [phone]

## 2018-02-08 NOTE — Therapy (Addendum)
[facility] [address], [place], [place], [address] Phone: [phone]   Fax:  [phone]  Physical Therapy Treatment  Patient Details  Name: Shawn Hicks MRN: [id number] Date of Birth: 01-19-1966 Referring Provider: Dr. Ardis Hughs   Encounter Date: 02/08/2018 Visit number                                           2 Date for PT re-evaluation                      04/26/2018 Authorization type                                  [employer] PT start time                                          930 PT stop time                                          1100 PT Time Calculation                              45 min Activity Tolerance                                  Patient tolerated treatment well Behavior During Therapy                       [facility] for tasks assessed/performed   Past Medical History:  Diagnosis Date  . Back pain   . Bilateral medial epicondylitis of elbow joint   . Boil    under arm right  . Chest pain   . Depression   . Diabetes mellitus without complication ([place])   . ED (erectile dysfunction)   . Elevated PSA   . GERD (gastroesophageal reflux disease)   . Gout   . Hyperlipidemia   . Hypertension   . Migraine    [place]  . Obesity   . OSA on CPAP   . Palpitations   . Pneumonia    Hospitalized childhood  . Prostate cancer ([place])   . Tobacco abuse     Past Surgical History:  Procedure Laterality Date  . DENTAL SURGERY     Wisdom teeth  . LYMPHADENECTOMY N/A 01/19/2018   Procedure: BILATERAL LYMPHADENECTOMY;  Surgeon: Ardis Hughs, MD;  [facility];  Service: Urology;  Laterality: N/A;  . PROSTATE BIOPSY    . ROBOT ASSISTED LAPAROSCOPIC RADICAL PROSTATECTOMY N/A 01/19/2018   Procedure: XI ROBOTIC ASSISTED LAPAROSCOPIC RADICAL PROSTATECTOMY;  Surgeon: Ardis Hughs, MD;  Location: [facility];  Service: Urology;  Laterality: N/A;    There were no vitals filed for this  visit.  Subjective Assessment - 02/08/18 0937    Subjective  My saddle is very sore.  I am still aching in my penis. I have a lot  of soreness in the lower  abdomen. I have been having alot of pain in my feet on the bottom . I walk 10 minutes 2 times per day.     Patient Stated Goals  decrease pain and decrease urinary leakage    Currently in Pain?  Yes    Pain Score  5     Pain Location  Abdomen    Pain Orientation  Right    Pain Descriptors / Indicators  Shooting    Pain Type  Surgical pain    Pain Onset  1 to 4 weeks ago    Pain Frequency  Intermittent    Aggravating Factors   movement, sitting on toilet, bending over, on feet for  awhile    Pain Relieving Factors  hold right lower abdomen in                        [facility] Adult PT Treatment/Exercise - 02/08/18 0001      Self-Care   Self-Care  Other Self-Care Comments    Other Self-Care Comments   diet to reduce bladder irritants; how to use a penile pump and work with his erections      Manual Therapy   Manual Therapy  Soft tissue mobilization    Soft tissue mobilization  scar massage to healed scars, circular abdominal massage, bil. ischiocavernosus, bulbococcygeus, and hip adductors             PT Education - 02/08/18 1056    Education provided  Yes    Education Details  pelvic floor exercise, stretching, information on penile pump, information on working on his erection, how to perform soft tissue work to the perineum and hip adductors    Person(s) Educated  Patient    Methods  Explanation;Demonstration;Verbal cues;Handout    Comprehension  Returned demonstration;Verbalized understanding       PT Short Term Goals - 02/08/18 1140      PT SHORT TERM GOAL #1   Title  understand how to perform scar massage to reduce pain    Time  4    Period  Weeks    Status  Achieved      PT SHORT TERM GOAL #2   Title  ability to perform a pelvic floor contraction to work on decreasing reduction of urinary leakage     Time  4    Period  Weeks    Status  Achieved      PT SHORT TERM GOAL #3   Title  understand ways to reduce urinary leakage with penile clamp and articuff    Time  4    Period  Weeks    Status  Achieved      PT SHORT TERM GOAL #4   Title  understand correct toileting technique to reduce strain on the pelvic floor    Time  4    Period  Weeks    Status  Achieved        PT Long Term Goals - 02/01/18 1437      PT LONG TERM GOAL #1   Title  independent with HEP and understand how to progress his program    Time  12    Period  Weeks    Status  New    Target Date  04/26/18      PT LONG TERM GOAL #2   Title  ability to hold a pelvic floor contraction for 10 seconds to reduce urinary leakage >/= 75%    Time  12  Period  Weeks    Status  New    Target Date  04/26/18      PT LONG TERM GOAL #3   Title  urinary leakage during the night decreased >/= 75% due to improve strength and endurance of pelvic floor muscles    Time  12    Period  Weeks    Status  New    Target Date  04/26/18      PT LONG TERM GOAL #4   Title  ability to go from sit to  stand with urinary leakage decreased </- 50% due to not holding breath and pushing on the pelvic floor contraction    Time  12    Period  Weeks    Status  New    Target Date  04/26/18            Plan - 02/08/18 1117    Clinical Impression Statement  Patient has muscle soreness and tightness in the perineal area and bilateral hip adductors.  Patient had reduction of soreness after manual work.  Patient has decreased mobility of abdominal scar and suprapubic area.  Patient understands how to promote and erection and using a penile pump.  Patient has learned his stretches. Patient has a high co-pay so he is spreading out his treatments and mostly home exercise program. Patient will benefit from skilled therapy to reduce urinary leakage and restore pelvic floor muscle function while reducing pain.     Rehab Potential  Excellent     Clinical Impairments Affecting Rehab Potential  s/p prostectomy 01/19/2018; Diabetic; Back pain, Hypertension    PT Frequency  1x / week    PT Duration  12 weeks    PT Treatment/Interventions  Biofeedback;Moist Heat;Therapeutic exercise;Therapeutic activities;Functional mobility training;Neuromuscular re-education;Patient/family education;Scar mobilization;Manual techniques;Passive range of motion;Energy conservation    PT Next Visit Plan  pelvic floor contraction; core strength with extremity movement; progress pelvic floor exercises    PT Home Exercise Plan  progress everytime he is seen    Recommended Other Services  MD signed the initial evaluation    Consulted and Agree with Plan of Care  Patient       Patient will benefit from skilled therapeutic intervention in order to improve the following deficits and impairments:  Pain, Decreased strength, Decreased scar mobility, Decreased coordination  Visit Diagnosis: Muscle weakness (generalized)  Unspecified lack of coordination  Aftercare following surgery for neoplasm  Prostate cancer [facility])     Problem List Patient Active Problem List   Diagnosis Date Noted  . Prostate cancer ([place]) 01/19/2018  . Chest pain, rule out acute myocardial infarction 01/16/2018  . HLD (hyperlipidemia) 01/16/2018  . Malignant neoplasm of prostate ([place]) 12/12/2017  . 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 Nocturnal hypoxemia due to emphysema ([place]) 05/08/2017  . Snorings 11/10/2014  . Severe obesity (BMI >= 40) ([place]) 11/10/2014  . Excessive daytime sleepiness 11/10/2014  . Sleep disorder, circadian, shift work type 11/10/2014  . Hypertension   . Chest pain 08/27/2014  . Tobacco abuse 08/27/2014  . Type 2 diabetes mellitus ([place]) 08/27/2014  . Essential hypertension 08/27/2014  . Palpitations 07/31/2013  . Obesity 07/31/2013  . Hyposmolality and/or hyponatremia 07/31/2013    Earlie Counts, PT 02/08/18 11:42 AM   [facility] [address], [place], [place], [address] Phone: [phone]   Fax:  [phone]  Name: Shawn Hicks MRN: [id number] Date of Birth: [birth year]

## 2018-03-07 ENCOUNTER — Encounter: Payer: 59 | Admitting: Physical Therapy

## 2018-03-12 ENCOUNTER — Encounter: Payer: Self-pay | Admitting: Physical Therapy

## 2018-03-12 ENCOUNTER — Ambulatory Visit: Payer: 59 | Attending: Urology | Admitting: Physical Therapy

## 2018-03-12 DIAGNOSIS — M6281 Muscle weakness (generalized): Secondary | ICD-10-CM | POA: Insufficient documentation

## 2018-03-12 DIAGNOSIS — C61 Malignant neoplasm of prostate: Secondary | ICD-10-CM | POA: Diagnosis present

## 2018-03-12 DIAGNOSIS — Z483 Aftercare following surgery for neoplasm: Secondary | ICD-10-CM | POA: Diagnosis present

## 2018-03-12 DIAGNOSIS — R279 Unspecified lack of coordination: Secondary | ICD-10-CM | POA: Diagnosis present

## 2018-03-12 NOTE — Therapy (Signed)
[facility] [address] [place], [place], [address] Phone: [phone]   Fax:  [phone]  Physical Therapy Treatment  Patient Details  Name: Shawn Hicks MRN: [id number] Date of Birth: 27-Sep-1965 Referring Provider: Dr. Ardis Hughs   Encounter Date: 03/12/2018  PT End of Session - 03/12/18 1100    Visit Number  4    Date for PT Re-Evaluation  04/26/18    Authorization Type  [employer]    PT Start Time  1015    PT Stop Time  1100    PT Time Calculation (min)  45 min    Activity Tolerance  Patient tolerated treatment well    Behavior During Therapy  [facility] for tasks assessed/performed       Past Medical History:  Diagnosis Date  . Back pain   . Bilateral medial epicondylitis of elbow joint   . Boil    under arm right  . Chest pain   . Depression   . Diabetes mellitus without complication ([place])   . ED (erectile dysfunction)   . Elevated PSA   . GERD (gastroesophageal reflux disease)   . Gout   . Hyperlipidemia   . Hypertension   . Migraine    [place]  . Obesity   . OSA on CPAP   . Palpitations   . Pneumonia    Hospitalized childhood  . Prostate cancer ([place])   . Tobacco abuse     Past Surgical History:  Procedure Laterality Date  . DENTAL SURGERY     Wisdom teeth  . LYMPHADENECTOMY N/A 01/19/2018   Procedure: BILATERAL LYMPHADENECTOMY;  Surgeon: Ardis Hughs, MD;  Location: [facility];  Service: Urology;  Laterality: N/A;  . PROSTATE BIOPSY    . ROBOT ASSISTED LAPAROSCOPIC RADICAL PROSTATECTOMY N/A 01/19/2018   Procedure: XI ROBOTIC ASSISTED LAPAROSCOPIC RADICAL PROSTATECTOMY;  Surgeon: Ardis Hughs, MD;  Location: [facility];  Service: Urology;  Laterality: N/A;    There were no vitals filed for this visit.  Subjective Assessment - 03/12/18 1015    Subjective  I have less urinary leakage age night.  When I get up to go to the bathroom I leak. I do not feel like I have to go until I am  going. We are remodeling our [place] so I am very busy.  I have some soreness in the saddle area.  The doctor okayed me to go back to work. I have no abdominal pain now. My PSA level is very low.     Patient Stated Goals  decrease pain and decrease urinary leakage    Currently in Pain?  No/denies    Multiple Pain Sites  No                    Pelvic Floor Special Questions - 03/12/18 0001    Biofeedback  resting level 2 uv; contract to 40uv in sidely, contract 10 sec aboe 30 uv with breathing in standing;      Biofeedback sensor type  Surface rectal                PT Education - 03/12/18 1100    Education provided  Yes    Education Details  information on penile pump; how to use a clamp; pelvic floor contraction with diffierent functional positions and strengthening the abdominals    Person(s) Educated  Patient    Methods  Explanation;Demonstration;Verbal cues;Handout    Comprehension  Returned demonstration;Verbalized understanding  PT Short Term Goals - 03/12/18 1018      PT SHORT TERM GOAL #1   Title  understand how to perform scar massage to reduce pain    Time  4    Period  Weeks    Status  Achieved      PT SHORT TERM GOAL #2   Title  ability to perform a pelvic floor contraction to work on decreasing reduction of urinary leakage    Period  Weeks    Status  Achieved      PT SHORT TERM GOAL #3   Title  understand ways to reduce urinary leakage with penile clamp and articuff    Time  4    Period  Weeks    Status  Achieved      PT SHORT TERM GOAL #4   Title  understand correct toileting technique to reduce strain on the pelvic floor    Time  4    Period  Weeks    Status  Achieved        PT Long Term Goals - 03/12/18 1018      PT LONG TERM GOAL #1   Title  independent with HEP and understand how to progress his program    Time  12    Period  Weeks    Status  On-going      PT LONG TERM GOAL #2   Title  ability to hold a pelvic  floor contraction for 10 seconds to reduce urinary leakage >/= 75%    Time  12    Period  Weeks    Status  On-going      PT LONG TERM GOAL #3   Title  urinary leakage during the night decreased >/= 75% due to improve strength and endurance of pelvic floor muscles    Time  12    Period  Weeks    Status  On-going      PT LONG TERM GOAL #4   Title  ability to go from sit to  stand with urinary leakage decreased </- 50% due to not holding breath and pushing on the pelvic floor contraction    Time  12    Period  Weeks    Status  On-going            Plan - 03/12/18 1101    Clinical Impression Statement  Patient understands how to use the penile clamp and penile pump.  Patient is using 8 depends per day. Patient does not have the urge to urinate. Patient was educated on timed voiding using the clamp.  Patient is not having pain in the abdominal area. Patient reports some soreness in the saddle area.  Patient resting level is 2 uv.  Patient able to contract to 40 uf but will fatique after 10 seconds in standing.  Patient understands he has to breath with transitional movements and with pelvic floor contraction to reduce the strain on the pelvic floor and decrease urinary leakage.  Patient will benefit from skilled therapy to reduce urinary leakage and restore pelvic floor muscle function while reducing pain.     Rehab Potential  Excellent    Clinical Impairments Affecting Rehab Potential  s/p prostectomy 01/19/2018; Diabetic; Back pain, Hypertension    PT Frequency  1x / week    PT Duration  12 weeks    PT Treatment/Interventions  Biofeedback;Moist Heat;Therapeutic exercise;Therapeutic activities;Functional mobility training;Neuromuscular re-education;Patient/family education;Scar mobilization;Manual techniques;Passive range of motion;Energy conservation    PT Next  Visit Plan  pelvic floor contraction; core strength with extremity movement; progress pelvic floor exercises using the pelvic EMG     PT Home Exercise Plan  progress everytime he is seen    Consulted and Agree with Plan of Care  Patient       Patient will benefit from skilled therapeutic intervention in order to improve the following deficits and impairments:  Pain, Decreased strength, Decreased scar mobility, Decreased coordination  Visit Diagnosis: Muscle weakness (generalized)  Unspecified lack of coordination  Aftercare following surgery for neoplasm  Prostate cancer [facility])     Problem List Patient Active Problem List   Diagnosis Date Noted  . Prostate cancer ([place]) 01/19/2018  . Chest pain, rule out acute myocardial infarction 01/16/2018  . HLD (hyperlipidemia) 01/16/2018  . Malignant neoplasm of prostate ([place]) 12/12/2017  . 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 Nocturnal hypoxemia due to emphysema ([place]) 05/08/2017  . Snorings 11/10/2014  . Severe obesity (BMI >= 40) ([place]) 11/10/2014  . Excessive daytime sleepiness 11/10/2014  . Sleep disorder, circadian, shift work type 11/10/2014  . Hypertension   . Chest pain 08/27/2014  . Tobacco abuse 08/27/2014  . Type 2 diabetes mellitus ([place]) 08/27/2014  . Essential hypertension 08/27/2014  . Palpitations 07/31/2013  . Obesity 07/31/2013  . Hyposmolality and/or hyponatremia 07/31/2013    Earlie Counts, PT 03/12/18 11:43 AM   [facility] [address] [place], [place], [address] Phone: [phone]   Fax:  [phone]  Name: CHRISTOPHERJAME CARNELL MRN: [id number] Date of Birth: July 11, 1966

## 2018-03-12 NOTE — Patient Instructions (Addendum)
Wear the penile clamp and every 2 hours go to the bathroom. Do not wear at night.   Clamp Protocol 1.  Fit penile clamp-Weisner or D[employer]blestop 2. Wear all day  during the waking hours ONLY. Do not wear when sleeping. 3. Place clamp just at the base of the penis 4. Wear for a minimum of 2 hours, max 4 hours 5. Wear 6 days/week, with one day off to re-assess 6. Bladder Chart on the day off 7. On day off see how long you can go without leaking but continue urinating 2 hours  8. When weaning off the clamp wear a pad just in case of leakage. 9. Wear 4-6 weeks, , wean off pads 10. May use in combination of medication that doctor may give you         Quick Contraction: Gravity Resisted (Sitting)    Sitting, quickly squeeze then fully relax pelvic floor. Perform __1_ sets of _5__. Rest for _1__ seconds between sets. Do __2_ times a day.  Copyright  VHI. All rights reserved.  Slow Contraction: Gravity Resisted (Sitting)    Sitting, slowly squeeze pelvic floor for _10__ seconds. Rest for _10__ seconds. Repeat _10__ times. Do __2_ times a day.  Copyright  VHI. All rights reserved.  Quick Contraction: Gravity Resisted (Standing)    Standing, quickly squeeze then fully relax pelvic floor. Perform __1_ sets of _5__. Rest for _1__ seconds between sets. Do 2___ times a day.  Copyright  VHI. All rights reserved.   Slow Contraction: Gravity Resisted (Standing)    Standing, slowly squeeze pelvic floor for _10__ seconds. Rest for _10__ seconds. Repeat _10__ times. Do __2_ times a day.  Copyright  VHI. All rights reserved.  Squat    Standing, squeeze pelvic floor and hold. Squat. Relax. Repeat _10__ times. Do _1__ times a day. Do not let knees go past feet.  Copyright  VHI. All rights reserved.  Bending Down: Phase 3    Stand in stride position. Squeeze pelvic floor. Lean forward while extending back leg for balance and touch ground. (Golfer style) Relax. Repeat _5_  times. Do __1_ times a day.   Copyright  VHI. All rights reserved.  Sit to Stand: Phase 3    Sitting, squeeze pelvic floor and hold. Lean trunk forward. Push up on arms and stand up. Relax. Repeat _5_ times. Do __1_ times a day. Breath out when going into standing. Breath out when going into sitting.  Copyright  VHI. All rights reserved.  Supine to Sit: Phase 4    On back, squeeze pelvic floor and hold. Inhale. Exhale. Roll to side. Push up on elbows as feet are off bed. Sit up. Relax. Repeat _5__ times. Do 1___ times a day.   Copyright  VHI. All rights reserved.  Laying down hold for 15 seconds.   Abdominal Bracing With Pelvic Floor (Hook-Lying)    With neutral spine, tighten pelvic floor and abdominals. Hold for 5 seconds. Make sure you are breathing. Repeat _5_ times. Do _1__ times a day.   Copyright  VHI. All rights reserved.    Bracing With Knee Fallout (Hook-Lying)    With neutral spine, tighten pelvic floor and abdominals and hold. Alternating legs, drop knee out to side. Keep opposite hip still. Repeat _10__ times. Do __1_ times a day.   Copyright  VHI. All rights reserved.  Bracing With Arms / Legs (Hook-Lying)    With neutral spine, tighten pelvic floor and abdominals and hold. Raise arm and opposite leg,  then return. Repeat wtih other limbs. Repeat _10__ times. Do __1_ times a day.   Copyright  VHI. All rights reserved.  [facility] [address], [place], [place] [address] Phone # [phone] Fax [phone]   Penile Pump Protocol Prior to using the pump retract the foreskin of the penis and shave the pubic hair Prior to placing pump on penis, retract the foreskin and shave the pubic hair Place water based lubricant only on the ring.  Place pump over the penis to the base.  Use slow gentle compressions till erection Maintain erection for 5 seconds then release, So for 10-20 erections for 10 min per day for 4  weeks Then progress to  3 minutes on, 1 minute off, 3 repetitions of this sequence for 3 times per week up to 12 months For post-prostatectomy, use as early as 4 weeks post-operatively, up to 12 months  Device may be covered by insurance if billed for "penile rehabilitation"  The device is to stretch and increase strength of penis [employer] vides "Physiotherapy Penile Rehab for Erectile Dysfunction ( use of vacuum pump)  Types of devices Penile pumps  1. Vacurect Vacuum Therapy Erectile Dysfunction Device  by Vacurect Can get on [employer] .  2. Active Erection System [email or web address]pl

## 2018-04-02 ENCOUNTER — Ambulatory Visit: Payer: 59 | Attending: Urology | Admitting: Physical Therapy

## 2018-04-02 ENCOUNTER — Encounter: Payer: Self-pay | Admitting: Physical Therapy

## 2018-04-02 DIAGNOSIS — Z483 Aftercare following surgery for neoplasm: Secondary | ICD-10-CM

## 2018-04-02 DIAGNOSIS — R279 Unspecified lack of coordination: Secondary | ICD-10-CM | POA: Insufficient documentation

## 2018-04-02 DIAGNOSIS — C61 Malignant neoplasm of prostate: Secondary | ICD-10-CM | POA: Insufficient documentation

## 2018-04-02 DIAGNOSIS — M6281 Muscle weakness (generalized): Secondary | ICD-10-CM | POA: Insufficient documentation

## 2018-04-02 NOTE — Patient Instructions (Addendum)
Angry Cat Stretch    Tuck chin and tighten stomach, arching back. Repeat __10__ times per set. Do __1__ sets per session. Do _1___ sessions per day.  http://[email or web address].us/118   Copyright  VHI. All rights reserved.   Lower Trunk Rotation Stretch    Keeping back flat and feet together, rotate knees to left side. Hold _30___ seconds. Repeat __2__ times per set. Do ___1_ sets per session. Do _1___ sessions per day.  http://[email or web address].us/122   Copyright  VHI. All rights reserved.  Mid-Back Rotation Stretch    Reach to each side as far as possible, keeping chest low to floor. Hold _30___ seconds. Repeat __2__ times per set. Do __1__ sets per session. Do _1___ sessions per day.  http://[email or web address].us/132   Copyright  VHI. All rights reserved.  Mid-Back Stretch    Push chest toward floor, reaching forward as far as possible. Hold _30___ seconds. Repeat __2__ times per set. Do __1__ sets per session. Do _1___ sessions per day.  http://[email or web address].[email or web address]/130   Copyright  VHI. All rights reserved.  Chair Sitting    Sit at edge of seat, spine straight, one leg extended. Put a hand on each thigh and bend forward from the hip, keeping spine straight. Allow hand on extended leg to reach toward toes. Support upper body with other arm. Hold _30__ seconds. Repeat _2__ times per session. Do __1_ sessions per day.  Copyright  VHI. All rights reserved.  Piriformis Stretch, Sitting    Sit, one ankle on opposite knee, same-side hand on crossed knee. Push down on knee, keeping spine straight. Lean torso forward, with flat back, until tension is felt in hamstrings and gluteals of crossed-leg side. Hold _30__ seconds.  Repeat _2__ times per session. Do _1__ sessions per day.  Copyright  VHI. All rights reserved.  Lumbar Rotation (Sitting)    Arms crossed, gently rotate trunk from side to side in a small, pain-free range of motion. Hold 10 seconds.  Repeat __2__ times per set. Do _1___  sets per session. Do __1__ sessions per day.  http://[email or web address].exer.us/164   Copyright  VHI. All rights reserved.   Sit with legs apart and lean forward with chest. Hold 30 seconds  2 times.    Abdominal Bracing With Pelvic Floor (Hook-Lying)    With neutral spine, tighten pelvic floor and abdominals. Hold for 5 seconds. Make sure you are breathing. Repeat _5_ times. Do _1__ times a day.   Copyright  VHI. All rights reserved.    Bracing With Knee Fallout (Hook-Lying)    With neutral spine, tighten pelvic floor and abdominals and hold. Alternating legs, drop knee out to side. Keep opposite hip still. Repeat _10__ times. Do __1_ times a day.   Copyright  VHI. All rights reserved.  Bracing With Arms / Legs (Hook-Lying)    With neutral spine, tighten pelvic floor and abdominals and hold. Raise arm and opposite leg, then return. Repeat wtih other limbs. Repeat _10__ times. Do __1_ times a day.   Copyright  VHI. All rights reserved.  [facility] [address] [place], [place] [address] Phone # [phone] Fax [phone]

## 2018-04-02 NOTE — Therapy (Signed)
[facility] [address] [place], [place], [address] Phone: [phone]   Fax:  [phone]  Physical Therapy Treatment  Patient Details  Name: Shawn Hicks MRN: [id number] Date of Birth: Jan 27, 1966 Referring Provider: Dr. Carmelina Paddock   Encounter Date: 04/02/2018  PT End of Session - 04/02/18 1111    Visit Number  5    Date for PT Re-Evaluation  08/26/18    Authorization Type  [employer]    PT Start Time  1100    PT Stop Time  1140    PT Time Calculation (min)  40 min    Activity Tolerance  Patient tolerated treatment well    Behavior During Therapy  [facility] for tasks assessed/performed       Past Medical History:  Diagnosis Date  . Back pain   . Bilateral medial epicondylitis of elbow joint   . Boil    under arm right  . Chest pain   . Depression   . Diabetes mellitus without complication ([place])   . ED (erectile dysfunction)   . Elevated PSA   . GERD (gastroesophageal reflux disease)   . Gout   . Hyperlipidemia   . Hypertension   . Migraine    Occular  . Obesity   . OSA on CPAP   . Palpitations   . Pneumonia    Hospitalized childhood  . Prostate cancer ([place])   . Tobacco abuse     Past Surgical History:  Procedure Laterality Date  . DENTAL SURGERY     Wisdom teeth  . LYMPHADENECTOMY N/A 01/19/2018   Procedure: BILATERAL LYMPHADENECTOMY;  Surgeon: Ardis Hughs, MD;  Location: [facility];  Service: Urology;  Laterality: N/A;  . PROSTATE BIOPSY    . ROBOT ASSISTED LAPAROSCOPIC RADICAL PROSTATECTOMY N/A 01/19/2018   Procedure: XI ROBOTIC ASSISTED LAPAROSCOPIC RADICAL PROSTATECTOMY;  Surgeon: Ardis Hughs, MD;  Location: [facility];  Service: Urology;  Laterality: N/A;    There were no vitals filed for this visit.  Subjective Assessment - 04/02/18 1104    Subjective  I am still sleeping in a recliner but comfortable. I am not doing the exercises as I should.  I get the urge to urinate at  night and go to the bathroom.  I am working now and working at home.  I use the clamp at work and no feeling it is backing up and urinate every 2 hours. I feel like I get a little urge to go to the bathroom.  I can make my urine stream slow down but not stop.     Patient Stated Goals  decrease pain and decrease urinary leakage    Currently in Pain?  Yes    Pain Score  8     Pain Location  Back    Pain Orientation  Lower    Pain Descriptors / Indicators  Sharp    Pain Type  Acute pain    Pain Onset  1 to 4 weeks ago    Pain Frequency  Intermittent    Aggravating Factors   laying flat to sleep    Pain Relieving Factors  get up, sleep reclined    Multiple Pain Sites  No         [facility] PT Assessment - 04/02/18 0001      Assessment   Medical Diagnosis  C61Prostate cancer    Referring Provider  Dr. Carmelina Paddock    Onset Date/Surgical Date  01/19/18  Prior Therapy  None      Precautions   Precautions  Other (comment)    Precaution Comments  cancer      Restrictions   Weight Bearing Restrictions  No      Home Environment   Living Environment  Private residence      Prior Function   Level of Independence  Independent    Vocation  Full time employment    Vocation Requirements  standing, lifting, climbing, on knees    Leisure  walking      Cognition   Overall Cognitive Status  Within Functional Limits for tasks assessed      Posture/Postural Control   Posture/Postural Control  No significant limitations      AROM   Overall AROM   Within functional limits for tasks performed      Strength   Right Hip Extension  4-/5    Right Hip ABduction  4/5    Left Hip Extension  4-/5    Left Hip ABduction  4/5                Pelvic Floor Special Questions - 04/02/18 0001    Currently Sexually Active  No    Urinary Leakage  Yes    How often  all day    Pad use  changes every 2-3 hours    Activities that cause leaking  With strong  urge;Coughing;Sneezing;Laughing;Lifting;Bending;Walking;Running;Exercising    Urinary urgency  Yes    Urinary frequency  2-3 times at night, every 2 hours    Exam Type  Deferred    Biofeedback  resting level 2 uv; contract to 40uv in sidely, contract 10 sec aboe 30 uv with breathing in standing;      Biofeedback sensor type  Surface rectal        [facility] Adult PT Treatment/Exercise - 04/02/18 0001      Manual Therapy   Manual Therapy  Soft tissue mobilization;Joint mobilization    Manual therapy comments  using assistive device to increase mobility of lumbar paraspinals    Joint Mobilization  P-A mobilization to T8-L5    Soft tissue mobilization  lumbar and thoracic paraspinals and quadratus             PT Education - 04/02/18 1148    Education provided  Yes    Education Details  stretches for lumbar and hips; reviewed pelvic floor exercises that he is to do at night so he is more consistent with HEP    Person(s) Educated  Patient    Methods  Explanation;Demonstration;Verbal cues;Handout    Comprehension  Returned demonstration;Verbalized understanding       PT Short Term Goals - 03/12/18 1018      PT SHORT TERM GOAL #1   Title  understand how to perform scar massage to reduce pain    Time  4    Period  Weeks    Status  Achieved      PT SHORT TERM GOAL #2   Title  ability to perform a pelvic floor contraction to work on decreasing reduction of urinary leakage    Period  Weeks    Status  Achieved      PT SHORT TERM GOAL #3   Title  understand ways to reduce urinary leakage with penile clamp and articuff    Time  4    Period  Weeks    Status  Achieved      PT SHORT TERM GOAL #4  Title  understand correct toileting technique to reduce strain on the pelvic floor    Time  4    Period  Weeks    Status  Achieved        PT Long Term Goals - 04/02/18 1155      PT LONG TERM GOAL #1   Title  independent with HEP and understand how to progress his program     Baseline  still learning    Time  12    Period  Weeks    Status  On-going      PT LONG TERM GOAL #2   Title  ability to hold a pelvic floor contraction for 10 seconds to reduce urinary leakage >/= 75%    Baseline  leakage improved by 25%    Time  12    Period  Weeks      PT LONG TERM GOAL #3   Title  urinary leakage during the night decreased >/= 75% due to improve strength and endurance of pelvic floor muscles    Baseline  leakage improved by 25%    Time  12    Period  Weeks    Status  On-going      PT LONG TERM GOAL #4   Title  ability to go from sit to  stand with urinary leakage decreased </- 50% due to not holding breath and pushing on the pelvic floor contraction    Baseline  leakage improved by 25%    Time  12    Period  Weeks    Status  On-going            Plan - 04/02/18 1149    Clinical Impression Statement  Patient is now getting the urge to urinate at night and some during the day. Patient understands the importance of his HEP to reduce urinary leakage.  Patient is having trouble being more consistent with HEP due to working long hours and remodeling his home.  Patient has difficulty with laying flat in his bed so he has to stay in his recliner due to low back pain.  Patient understands ways to incoporate his HEP during the day so he is able to accomplish his program.  Patient has no pain after therapy and full lumbar ROM.  Patient will benefit from skilled therapy to improve pelvic floor strength and core strength to reduce urinary leakage.     Rehab Potential  Excellent    Clinical Impairments Affecting Rehab Potential  s/p prostectomy 01/19/2018; Diabetic; Back pain, Hypertension    PT Frequency  [place]    PT Duration  Other (comment) 4 months    PT Treatment/Interventions  Biofeedback;Moist Heat;Therapeutic exercise;Therapeutic activities;Functional mobility training;Neuromuscular re-education;Patient/family education;Scar mobilization;Manual techniques;Passive  range of motion;Energy conservation    PT Next Visit Plan  pelvic floor contraction; core strength with extremity movement; progress pelvic floor exercises using the pelvic EMG    PT Home Exercise Plan  progress everytime he is seen    Recommended Other Services  renewal note sent ot MD 7/15/29019    Consulted and Agree with Plan of Care  Patient       Patient will benefit from skilled therapeutic intervention in order to improve the following deficits and impairments:  Pain, Decreased strength, Decreased scar mobility, Decreased coordination  Visit Diagnosis: Muscle weakness (generalized) - Plan: PT plan of care cert/re-cert  Unspecified lack of coordination - Plan: PT plan of care cert/re-cert  Aftercare following surgery for neoplasm - Plan: PT  plan of care cert/re-cert  Prostate cancer [facility]) - Plan: PT plan of care cert/re-cert     Problem List Patient Active Problem List   Diagnosis Date Noted  . Prostate cancer ([place]) 01/19/2018  . Chest pain, rule out acute myocardial infarction 01/16/2018  . HLD (hyperlipidemia) 01/16/2018  . Malignant neoplasm of prostate ([place]) 12/12/2017  . 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 Nocturnal hypoxemia due to emphysema ([place]) 05/08/2017  . Snorings 11/10/2014  . Severe obesity (BMI >= 40) ([place]) 11/10/2014  . Excessive daytime sleepiness 11/10/2014  . Sleep disorder, circadian, shift work type 11/10/2014  . Hypertension   . Chest pain 08/27/2014  . Tobacco abuse 08/27/2014  . Type 2 diabetes mellitus ([place]) 08/27/2014  . Essential hypertension 08/27/2014  . Palpitations 07/31/2013  . Obesity 07/31/2013  . Hyposmolality and/or hyponatremia 07/31/2013    Earlie Counts, PT 04/02/18 12:00 PM   [facility] [address], [place], [place], [address] Phone: [phone]   Fax:  [phone]  Name: Shawn Hicks MRN: [id number] Date of Birth: [birth year]

## 2018-04-30 ENCOUNTER — Ambulatory Visit: Payer: 59 | Attending: Urology | Admitting: Physical Therapy

## 2018-04-30 DIAGNOSIS — R279 Unspecified lack of coordination: Secondary | ICD-10-CM | POA: Insufficient documentation

## 2018-04-30 DIAGNOSIS — M6281 Muscle weakness (generalized): Secondary | ICD-10-CM | POA: Diagnosis present

## 2018-04-30 DIAGNOSIS — Z483 Aftercare following surgery for neoplasm: Secondary | ICD-10-CM | POA: Insufficient documentation

## 2018-04-30 DIAGNOSIS — C61 Malignant neoplasm of prostate: Secondary | ICD-10-CM | POA: Insufficient documentation

## 2018-04-30 NOTE — Therapy (Signed)
[facility] [address] [place], [place], [address] Phone: [phone]   Fax:  [phone]  Physical Therapy Treatment  Patient Details  Name: Shawn Hicks MRN: [id number] Date of Birth: [birth year] Referring Provider: Dr. Carmelina Paddock   Encounter Date: 04/30/2018  PT End of Session - 04/30/18 1120    Visit Number  6    Date for PT Re-Evaluation  08/26/18    Authorization Type  [employer]    PT Start Time  1102    PT Stop Time  1144    PT Time Calculation (min)  42 min    Activity Tolerance  Patient tolerated treatment well    Behavior During Therapy  [facility] for tasks assessed/performed       Past Medical History:  Diagnosis Date  . Back pain   . Bilateral medial epicondylitis of elbow joint   . Boil    under arm right  . Chest pain   . Depression   . Diabetes mellitus without complication ([place])   . ED (erectile dysfunction)   . Elevated PSA   . GERD (gastroesophageal reflux disease)   . Gout   . Hyperlipidemia   . Hypertension   . Migraine    Occular  . Obesity   . OSA on CPAP   . Palpitations   . Pneumonia    Hospitalized childhood  . Prostate cancer ([place])   . Tobacco abuse     Past Surgical History:  Procedure Laterality Date  . DENTAL SURGERY     Wisdom teeth  . LYMPHADENECTOMY N/A 01/19/2018   Procedure: BILATERAL LYMPHADENECTOMY;  Surgeon: Ardis Hughs, MD;  [facility];  Service: Urology;  Laterality: N/A;  . PROSTATE BIOPSY    . ROBOT ASSISTED LAPAROSCOPIC RADICAL PROSTATECTOMY N/A 01/19/2018   Procedure: XI ROBOTIC ASSISTED LAPAROSCOPIC RADICAL PROSTATECTOMY;  Surgeon: Ardis Hughs, MD;  [facility];  Service: Urology;  Laterality: N/A;    There were no vitals filed for this visit.  Subjective Assessment - 04/30/18 1106    Subjective  I am still having a lot of problems.  I don't feel like I have to go, but when I get up I'm just going.  I use the clamp at  work.  I try to squeeze as hard as I can but it comes out when I get up.    Patient Stated Goals  decrease pain and decrease urinary leakage    Currently in Pain?  No/denies                       [facility] Adult PT Treatment/Exercise - 04/30/18 0001      Neuro Re-ed    Neuro Re-ed Details   biofeedback used with all exercises placed on transverse peroneus      Lumbar Exercises: Standing   Other Standing Lumbar Exercises  marching with kegel - 5 sec hold      Lumbar Exercises: Seated   Hip Flexion on Ball Limitations  hip flexion sitting with kegel and breathing    Sit to Stand  10 reps   with kegel   Other Seated Lumbar Exercises  clam with red band - 15 x      Lumbar Exercises: Supine   Bent Knee Raise  15 reps               PT Short Term Goals - 03/12/18 1018      PT SHORT  TERM GOAL #1   Title  understand how to perform scar massage to reduce pain    Time  4    Period  Weeks    Status  Achieved      PT SHORT TERM GOAL #2   Title  ability to perform a pelvic floor contraction to work on decreasing reduction of urinary leakage    Period  Weeks    Status  Achieved      PT SHORT TERM GOAL #3   Title  understand ways to reduce urinary leakage with penile clamp and articuff    Time  4    Period  Weeks    Status  Achieved      PT SHORT TERM GOAL #4   Title  understand correct toileting technique to reduce strain on the pelvic floor    Time  4    Period  Weeks    Status  Achieved        PT Long Term Goals - 04/30/18 1127      PT LONG TERM GOAL #1   Title  independent with HEP and understand how to progress his program    Baseline  still learning    Time  12    Period  Weeks    Status  On-going      PT LONG TERM GOAL #2   Title  ability to hold a pelvic floor contraction for 10 seconds to reduce urinary leakage >/= 75%    Baseline  leakage improved by 25%    Time  12    Period  Weeks      PT LONG TERM GOAL #3   Title  urinary leakage  during the night decreased >/= 75% due to improve strength and endurance of pelvic floor muscles    Baseline  leakage improved by 25%    Time  12    Period  Weeks    Status  On-going      PT LONG TERM GOAL #4   Title  ability to go from sit to  stand with urinary leakage decreased </- 50% due to not holding breath and pushing on the pelvic floor contraction    Baseline  leakage improved by 25%, needs a lot of cues to exhale on exertion    Time  12    Period  Weeks    Status  On-going            Plan - 04/30/18 1414    Clinical Impression Statement  Pt did well with exercises needing a lot of cues to exhale while performing kegel and engaging abdominals.  Pt was able to get contraction around 10-91mV in various positions such as sitting and standing.  Pt is able to demonstrate engaged pelvic floor prior to standing.  He was able to engage and hold contraction while breathing normally . He will benefit from continued strengthening with skilled therapy in order to improve support and control of bladder.    PT Treatment/Interventions  Biofeedback;Moist Heat;Therapeutic exercise;Therapeutic activities;Functional mobility training;Neuromuscular re-education;Patient/family education;Scar mobilization;Manual techniques;Passive range of motion;Energy conservation    PT Next Visit Plan  pelvic floor contraction; core strength with extremity movement; progress pelvic floor exercises using the pelvic EMG    PT Home Exercise Plan  progress everytime he is seen; added LE marching in standing    Consulted and Agree with Plan of Care  Patient       Patient will benefit from skilled therapeutic intervention in order  to improve the following deficits and impairments:     Visit Diagnosis: Muscle weakness (generalized)  Unspecified lack of coordination  Aftercare following surgery for neoplasm  Prostate cancer [facility])     Problem List Patient Active Problem List   Diagnosis Date Noted  .  Prostate cancer ([place]) 01/19/2018  . Chest pain, rule out acute myocardial infarction 01/16/2018  . HLD (hyperlipidemia) 01/16/2018  . Malignant neoplasm of prostate ([place]) 12/12/2017  . 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 Nocturnal hypoxemia due to emphysema ([place]) 05/08/2017  . Snorings 11/10/2014  . Severe obesity (BMI >= 40) ([place]) 11/10/2014  . Excessive daytime sleepiness 11/10/2014  . Sleep disorder, circadian, shift work type 11/10/2014  . Hypertension   . Chest pain 08/27/2014  . Tobacco abuse 08/27/2014  . Type 2 diabetes mellitus ([place]) 08/27/2014  . Essential hypertension 08/27/2014  . Palpitations 07/31/2013  . Obesity 07/31/2013  . Hyposmolality and/or hyponatremia 07/31/2013    Zannie Cove, PT 04/30/2018, 2:27 PM  [facility] [address], [place], [place], [address] Phone: [phone]   Fax:  [phone]  Name: MALACHI KINZLER MRN: [id number] Date of Birth: December 25, 1965

## 2018-05-29 ENCOUNTER — Encounter: Payer: 59 | Admitting: Physical Therapy

## 2018-07-06 ENCOUNTER — Ambulatory Visit: Payer: 59 | Admitting: Physical Therapy

## 2018-07-09 ENCOUNTER — Encounter: Payer: 59 | Admitting: Physical Therapy

## 2018-07-19 ENCOUNTER — Encounter: Payer: Self-pay | Admitting: Physical Therapy

## 2018-07-19 ENCOUNTER — Ambulatory Visit: Payer: 59 | Attending: Urology | Admitting: Physical Therapy

## 2018-07-19 DIAGNOSIS — C61 Malignant neoplasm of prostate: Secondary | ICD-10-CM

## 2018-07-19 DIAGNOSIS — M6281 Muscle weakness (generalized): Secondary | ICD-10-CM | POA: Diagnosis present

## 2018-07-19 DIAGNOSIS — R279 Unspecified lack of coordination: Secondary | ICD-10-CM | POA: Insufficient documentation

## 2018-07-19 DIAGNOSIS — Z483 Aftercare following surgery for neoplasm: Secondary | ICD-10-CM | POA: Diagnosis present

## 2018-07-19 NOTE — Patient Instructions (Signed)
Slow Contraction: Gravity Resisted (Sitting)    Sitting, slowly squeeze pelvic floor for _20__ seconds. Rest for __10_ seconds. Repeat __5_ times. Do _3__ times a day.  Copyright  VHI. All rights reserved.  Slow Contraction: Gravity Resisted (Standing)    Standing, slowly squeeze pelvic floor for __20_ seconds. Rest for __10_ seconds. Repeat _5__ times. Do _3__ times a day.  Copyright  VHI. All rights reserved.  Bending Down: Phase 3    Stand in stride position. Squeeze pelvic floor. Lean forward while extending back leg for balance and touch ground. (Golfer style) Relax. Repeat _5__ times. Do __1_ times a day.  Exhale when contract, then inhale in the middle of the task and exhale for the final standing Copyright  VHI. All rights reserved.  Walking: Phase 6    While marching in place , squeeze pelvic floor and hold squeeze and hold as you bring one leg up. Relax between bringing other knee up.  Repeat _10__ times each leg. . Do _1__ times a day.  Copyright  VHI. All rights reserved.  [facility] [address], [place], [place] [address] Phone # [phone] Fax [phone]

## 2018-07-19 NOTE — Therapy (Signed)
[facility] [address], [place], [place], [address] Phone: [phone]   Fax:  [phone]  Physical Therapy Treatment  Patient Details  Name: Shawn Hicks MRN: [id number] Date of Birth: [birth year] Referring Provider (PT): Dr. Carmelina Paddock   Encounter Date: 07/19/2018  PT End of Session - 07/19/18 1251    Visit Number  7    Date for PT Re-Evaluation  08/26/18    Authorization Type  [employer]    PT Start Time  1230    PT Stop Time  1310    PT Time Calculation (min)  40 min    Activity Tolerance  Patient tolerated treatment well    Behavior During Therapy  [facility] for tasks assessed/performed       Past Medical History:  Diagnosis Date  . Back pain   . Bilateral medial epicondylitis of elbow joint   . Boil    under arm right  . Chest pain   . Depression   . Diabetes mellitus without complication ([place])   . ED (erectile dysfunction)   . Elevated PSA   . GERD (gastroesophageal reflux disease)   . Gout   . Hyperlipidemia   . Hypertension   . Migraine    Occular  . Obesity   . OSA on CPAP   . Palpitations   . Pneumonia    Hospitalized childhood  . Prostate cancer ([place])   . Tobacco abuse     Past Surgical History:  Procedure Laterality Date  . DENTAL SURGERY     Wisdom teeth  . LYMPHADENECTOMY N/A 01/19/2018   Procedure: BILATERAL LYMPHADENECTOMY;  Surgeon: Ardis Hughs, MD;  Location: [facility];  Service: Urology;  Laterality: N/A;  . PROSTATE BIOPSY    . ROBOT ASSISTED LAPAROSCOPIC RADICAL PROSTATECTOMY N/A 01/19/2018   Procedure: XI ROBOTIC ASSISTED LAPAROSCOPIC RADICAL PROSTATECTOMY;  Surgeon: Ardis Hughs, MD;  Location: [facility];  Service: Urology;  Laterality: N/A;    There were no vitals filed for this visit.  Subjective Assessment - 07/19/18 1233    Subjective  I am doing a little better. I am not having leakage at night.  I leak the most at work. I have stopped using the clamp  since it has been 5 weeks.  I am doing the exercises. I have more sensation that I need to go to the bathroom.     Patient Stated Goals  decrease pain and decrease urinary leakage    Currently in Pain?  No/denies    Multiple Pain Sites  No                    Pelvic Floor Special Questions - 07/19/18 0001    Urinary Leakage  Yes    Activities that cause leaking  Walking;With strong urge;Coughing;Sneezing;Lifting;Bending    Urinary frequency  3-4 per 10 hour shift    Biofeedback  sitting hold 20 seconds above 15 uv  and able to breathe,     Biofeedback sensor type  Surface   rectal   Biofeedback Activity  10 second hold;Other   20 second contraction in standing and sitting, squat, lift                PT Education - 07/19/18 1311    Education provided  Yes    Education Details  hold pelvic floor contraction 20 seconds in sitting and standing, contract with lifting and squatting and marching    Person(s) Educated  Patient    Methods  Explanation;Demonstration;Verbal cues;Handout    Comprehension  Returned demonstration;Verbalized understanding       PT Short Term Goals - 03/12/18 1018      PT SHORT TERM GOAL #1   Title  understand how to perform scar massage to reduce pain    Time  4    Period  Weeks    Status  Achieved      PT SHORT TERM GOAL #2   Title  ability to perform a pelvic floor contraction to work on decreasing reduction of urinary leakage    Period  Weeks    Status  Achieved      PT SHORT TERM GOAL #3   Title  understand ways to reduce urinary leakage with penile clamp and articuff    Time  4    Period  Weeks    Status  Achieved      PT SHORT TERM GOAL #4   Title  understand correct toileting technique to reduce strain on the pelvic floor    Time  4    Period  Weeks    Status  Achieved        PT Long Term Goals - 07/19/18 1236      PT LONG TERM GOAL #1   Title  independent with HEP and understand how to progress his program     Baseline  still learning    Time  12    Period  Weeks    Status  On-going      PT LONG TERM GOAL #2   Title  ability to hold a pelvic floor contraction for 10 seconds to reduce urinary leakage >/= 75%    Baseline  leakage improved by 25%    Time  12    Period  Weeks    Status  On-going      PT LONG TERM GOAL #3   Title  urinary leakage during the night decreased >/= 75% due to improve strength and endurance of pelvic floor muscles    Time  12    Period  Weeks    Status  Achieved      PT LONG TERM GOAL #4   Title  ability to go from sit to  stand with urinary leakage decreased </- 50% due to not holding breath and pushing on the pelvic floor contraction    Baseline  leakage improved by 25%, needs a lot of cues to exhale on exertion    Time  12    Period  Weeks    Status  On-going            Plan - 07/19/18 1301    Clinical Impression Statement  Patient is not leaking during the night or sitting. Patient is leaking during work hours due to the strenous activity.  Patient is drinking alot of sugary sodas during the day and is working on reducing it. Patient is able to contract for 20 seconds in sitting and standing but is a struggle the last 5 seconds. Pateint needs to rest in the middle of a task then engage the muscles due to weakness.  Patient is able to perform a quick flick with greater ease.  Patient will benefit from skilled therapy to further increase strength of the pelvic floor with demanding tasks at work to reduce leakage and having to wear pads.     Rehab Potential  Excellent    Clinical Impairments Affecting Rehab Potential  s/p prostectomy 01/19/2018;  Diabetic; Back pain, Hypertension    PT Frequency  Monthy    PT Duration  Other (comment)   4 months   PT Treatment/Interventions  Biofeedback;Moist Heat;Therapeutic exercise;Therapeutic activities;Functional mobility training;Neuromuscular re-education;Patient/family education;Scar mobilization;Manual techniques;Passive  range of motion;Energy conservation    PT Next Visit Plan  pelvic floor EMG with standing and siting tasks    Recommended Other Services  MD signed intial note    Consulted and Agree with Plan of Care  Patient       Patient will benefit from skilled therapeutic intervention in order to improve the following deficits and impairments:  Pain, Decreased strength, Decreased scar mobility, Decreased coordination  Visit Diagnosis: Muscle weakness (generalized)  Unspecified lack of coordination  Aftercare following surgery for neoplasm  Prostate cancer [facility])     Problem List Patient Active Problem List   Diagnosis Date Noted  . Prostate cancer ([place]) 01/19/2018  . Chest pain, rule out acute myocardial infarction 01/16/2018  . HLD (hyperlipidemia) 01/16/2018  . Malignant neoplasm of prostate ([place]) 12/12/2017  . 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 Nocturnal hypoxemia due to emphysema ([place]) 05/08/2017  . Snorings 11/10/2014  . Severe obesity (BMI >= 40) ([place]) 11/10/2014  . Excessive daytime sleepiness 11/10/2014  . Sleep disorder, circadian, shift work type 11/10/2014  . Hypertension   . Chest pain 08/27/2014  . Tobacco abuse 08/27/2014  . Type 2 diabetes mellitus ([place]) 08/27/2014  . Essential hypertension 08/27/2014  . Palpitations 07/31/2013  . Obesity 07/31/2013  . Hyposmolality and/or hyponatremia 07/31/2013    Earlie Counts, PT 07/19/18 1:20 PM   [facility] [address], [place], [place], [address] Phone: [phone]   Fax:  [phone]  Name: Shawn Hicks MRN: [id number] Date of Birth: 1966-05-15

## 2018-08-06 ENCOUNTER — Ambulatory Visit: Payer: 59 | Attending: Urology | Admitting: Physical Therapy

## 2018-08-06 ENCOUNTER — Encounter: Payer: Self-pay | Admitting: Physical Therapy

## 2018-08-06 DIAGNOSIS — R279 Unspecified lack of coordination: Secondary | ICD-10-CM | POA: Insufficient documentation

## 2018-08-06 DIAGNOSIS — C61 Malignant neoplasm of prostate: Secondary | ICD-10-CM

## 2018-08-06 DIAGNOSIS — M6281 Muscle weakness (generalized): Secondary | ICD-10-CM | POA: Diagnosis present

## 2018-08-06 DIAGNOSIS — Z483 Aftercare following surgery for neoplasm: Secondary | ICD-10-CM | POA: Insufficient documentation

## 2018-08-06 NOTE — Therapy (Signed)
[facility] [address], [place], [place], [address] Phone: [phone]   Fax:  [phone]  Physical Therapy Treatment  Patient Details  Name: Shawn Hicks MRN: [id number] Date of Birth: [birth year] Referring Provider (PT): Dr. Carmelina Paddock   Encounter Date: 08/06/2018  PT End of Session - 08/06/18 1151    Visit Number  8    Date for PT Re-Evaluation  08/26/18    Authorization Type  [employer]    PT Start Time  1145    PT Stop Time  1225    PT Time Calculation (min)  40 min    Behavior During Therapy  [facility] for tasks assessed/performed       Past Medical History:  Diagnosis Date  . Back pain   . Bilateral medial epicondylitis of elbow joint   . Boil    under arm right  . Chest pain   . Depression   . Diabetes mellitus without complication ([place])   . ED (erectile dysfunction)   . Elevated PSA   . GERD (gastroesophageal reflux disease)   . Gout   . Hyperlipidemia   . Hypertension   . Migraine    Occular  . Obesity   . OSA on CPAP   . Palpitations   . Pneumonia    Hospitalized childhood  . Prostate cancer ([place])   . Tobacco abuse     Past Surgical History:  Procedure Laterality Date  . DENTAL SURGERY     Wisdom teeth  . LYMPHADENECTOMY N/A 01/19/2018   Procedure: BILATERAL LYMPHADENECTOMY;  Surgeon: Ardis Hughs, MD;  [facility];  Service: Urology;  Laterality: N/A;  . PROSTATE BIOPSY    . ROBOT ASSISTED LAPAROSCOPIC RADICAL PROSTATECTOMY N/A 01/19/2018   Procedure: XI ROBOTIC ASSISTED LAPAROSCOPIC RADICAL PROSTATECTOMY;  Surgeon: Ardis Hughs, MD;  [facility];  Service: Urology;  Laterality: N/A;    There were no vitals filed for this visit.  Subjective Assessment - 08/06/18 1146    Subjective  I may be feeling better a little bit. No leakage at night. At work it is physical and I have the most leakage. I have alot of back and shoulder pain.     Patient Stated Goals   decrease pain and decrease urinary leakage    Currently in Pain?  Yes    Pain Score  6     Pain Location  Back    Pain Orientation  Lower    Pain Descriptors / Indicators  Aching    Pain Type  Chronic pain    Pain Onset  More than a month ago    Pain Frequency  Intermittent    Aggravating Factors   laying too long    Pain Relieving Factors  movement    Multiple Pain Sites  No                    Pelvic Floor Special Questions - 08/06/18 0001    Pad use  working 4 pads, weekends 2    Biofeedback  sitting hold 20 seconds around 20 uv 3 times; standing 20 sec at 20 uv    Biofeedback sensor type  Surface   rectal   Biofeedback Activity  --   20 seconds; squats, lunges       [facility] Adult PT Treatment/Exercise - 08/06/18 0001      Manual Therapy   Manual Therapy  Soft tissue mobilization;Joint mobilization    Joint  Mobilization  P-A mobilization to T8-L5    Soft tissue mobilization  lumbar and thoracic paraspinals and quadratus             PT Education - 08/06/18 1213    Education provided  Yes    Education Details  pelvic floor contraction with lunges and squats, hold for 20 seconds in sitting and standing    Person(s) Educated  Patient    Methods  Explanation;Demonstration;Verbal cues    Comprehension  Verbalized understanding;Returned demonstration       PT Short Term Goals - 03/12/18 1018      PT SHORT TERM GOAL #1   Title  understand how to perform scar massage to reduce pain    Time  4    Period  Weeks    Status  Achieved      PT SHORT TERM GOAL #2   Title  ability to perform a pelvic floor contraction to work on decreasing reduction of urinary leakage    Period  Weeks    Status  Achieved      PT SHORT TERM GOAL #3   Title  understand ways to reduce urinary leakage with penile clamp and articuff    Time  4    Period  Weeks    Status  Achieved      PT SHORT TERM GOAL #4   Title  understand correct toileting technique to reduce strain on  the pelvic floor    Time  4    Period  Weeks    Status  Achieved        PT Long Term Goals - 08/06/18 1230      PT LONG TERM GOAL #1   Title  independent with HEP and understand how to progress his program    Baseline  still learning    Time  12    Period  Weeks    Status  On-going      PT LONG TERM GOAL #2   Title  ability to hold a pelvic floor contraction for 10 seconds to reduce urinary leakage >/= 75%    Baseline  leakage improved by 40%    Time  12    Period  Weeks    Status  On-going      PT LONG TERM GOAL #3   Title  urinary leakage during the night decreased >/= 75% due to improve strength and endurance of pelvic floor muscles    Time  12    Period  Weeks    Status  Achieved      PT LONG TERM GOAL #4   Title  ability to go from sit to  stand with urinary leakage decreased </- 50% due to not holding breath and pushing on the pelvic floor contraction    Baseline  leakage improved by 25%, needs a lot of cues to exhale on exertion    Period  Weeks    Status  On-going            Plan - 08/06/18 1151    Clinical Impression Statement  Patient is not having urinary leakage at night. Patient will go through 4 pads during work and 2 pads on the weekends. Patient is able to contract 20 uv for 20 seconds in sitting and standing. Patient has difficulty with squatting and lunges to get up and off the ground without leaking due to decreased pelvic floor strength and leg weakness. Patient will benefit from skilled therapy to further increase  strength of the pelvic floor with demanding tasks at work to reduce leakage and having to wear pads.     Rehab Potential  Excellent    Clinical Impairments Affecting Rehab Potential  s/p prostectomy 01/19/2018; Diabetic; Back pain, Hypertension    PT Frequency  Monthy    PT Duration  Other (comment)   4 months   PT Treatment/Interventions  Biofeedback;Moist Heat;Therapeutic exercise;Therapeutic activities;Functional mobility  training;Neuromuscular re-education;Patient/family education;Scar mobilization;Manual techniques;Passive range of motion;Energy conservation    PT Next Visit Plan  pelvic floor EMG with standing and siting tasks;; work task    PT Home Exercise Plan  progress everytime he is seen; added LE marching in standing    Consulted and Agree with Plan of Care  Patient       Patient will benefit from skilled therapeutic intervention in order to improve the following deficits and impairments:  Pain, Decreased strength, Decreased scar mobility, Decreased coordination  Visit Diagnosis: Muscle weakness (generalized)  Unspecified lack of coordination  Aftercare following surgery for neoplasm  Prostate cancer [facility])     Problem List Patient Active Problem List   Diagnosis Date Noted  . Prostate cancer ([place]) 01/19/2018  . Chest pain, rule out acute myocardial infarction 01/16/2018  . HLD (hyperlipidemia) 01/16/2018  . Malignant neoplasm of prostate ([place]) 12/12/2017  . 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 Nocturnal hypoxemia due to emphysema ([place]) 05/08/2017  . Snorings 11/10/2014  . Severe obesity (BMI >= 40) ([place]) 11/10/2014  . Excessive daytime sleepiness 11/10/2014  . Sleep disorder, circadian, shift work type 11/10/2014  . Hypertension   . Chest pain 08/27/2014  . Tobacco abuse 08/27/2014  . Type 2 diabetes mellitus ([place]) 08/27/2014  . Essential hypertension 08/27/2014  . Palpitations 07/31/2013  . Obesity 07/31/2013  . Hyposmolality and/or hyponatremia 07/31/2013    Earlie Counts, PT 08/06/18 12:32 PM    [facility] [address], [place], [place], [address] Phone: [phone]   Fax:  [phone]  Name: YOSHITO GAZA MRN: [id number] Date of Birth: Mar 09, 1966

## 2018-08-06 NOTE — Patient Instructions (Addendum)
Slow Contraction: Gravity Resisted (Sitting)    Sitting, slowly squeeze pelvic floor for _20__ seconds. Rest for _5__ seconds. Repeat __5_ times. Do _1__ times a day.  Copyright  VHI. All rights reserved.  Slow Contraction: Gravity Resisted (Standing)    Standing, slowly squeeze pelvic floor for __20_ seconds. Rest for _5__ seconds. Repeat _5__ times. Do __1_ times a day.  Copyright  VHI. All rights reserved.  Squat    Standing, squeeze pelvic floor and hold. Squat. Relax. Repeat _5__ times. Do __2_ times a day.  Copyright  VHI. All rights reserved.  Bracing With Forward Lunge (Standing)    Stand with hands on hips. Find neutral spine. Tighten pelvic floor and abdominals and hold. Alternating legs, step forward and bend knee to lower trunk. Repeat __5_ times. Do _1__ times a day. Then do 5 times with the other leg in front  Copyright  VHI. All rights reserved.  [facility] [address], [place], [place] [address] Phone # [phone] Fax [phone]

## 2018-08-27 ENCOUNTER — Ambulatory Visit: Payer: 59 | Admitting: Physical Therapy

## 2018-09-04 ENCOUNTER — Encounter: Payer: Self-pay | Admitting: Physical Therapy

## 2018-09-04 ENCOUNTER — Ambulatory Visit: Payer: 59 | Attending: Urology | Admitting: Physical Therapy

## 2018-09-04 DIAGNOSIS — M6281 Muscle weakness (generalized): Secondary | ICD-10-CM | POA: Insufficient documentation

## 2018-09-04 DIAGNOSIS — Z483 Aftercare following surgery for neoplasm: Secondary | ICD-10-CM | POA: Diagnosis present

## 2018-09-04 DIAGNOSIS — C61 Malignant neoplasm of prostate: Secondary | ICD-10-CM | POA: Insufficient documentation

## 2018-09-04 DIAGNOSIS — R279 Unspecified lack of coordination: Secondary | ICD-10-CM | POA: Insufficient documentation

## 2018-09-04 NOTE — Patient Instructions (Addendum)
Slow Contraction: Gravity Resisted (Sitting)    Sitting, slowly squeeze pelvic floor for _30__ seconds. Rest for _5__ seconds. Repeat __5_ times. Do _1__ times a day. End with 5 quick contractions  Copyright  VHI. All rights reserved.  Slow Contraction: Gravity Resisted (Standing)    Standing, slowly squeeze pelvic floor for __30_ seconds. Rest for _5__ seconds. Repeat _5__ times. Do __1_ times a day. End with 5 quick contractions  Copyright  VHI. All rights reserved.   Angry Cat Stretch    Tuck chin and tighten stomach, arching back. Repeat __10__ times per set. Do __1__ sets per session. Do _1___ sessions per day.  http://orth.exer.us/118   Copyright  VHI. All rights reserved.   Lower Trunk Rotation Stretch    Keeping back flat and feet together, rotate knees to left side. Hold _30___ seconds. Repeat __2__ times per set. Do ___1_ sets per session. Do _1___ sessions per day.  http://orth.exer.us/122   Copyright  VHI. All rights reserved.  Mid-Back Rotation Stretch    Reach to each side as far as possible, keeping chest low to floor. Hold _30___ seconds. Repeat __2__ times per set. Do __1__ sets per session. Do _1___ sessions per day.  http://orth.exer.us/132   Copyright  VHI. All rights reserved.  Mid-Back Stretch    Push chest toward floor, reaching forward as far as possible. Hold _30___ seconds. Repeat __2__ times per set. Do __1__ sets per session. Do _1___ sessions per day.  http://orth.exer.us/130   Copyright  VHI. All rights reserved.  Chair Sitting    Sit at edge of seat, spine straight, one leg extended. Put a hand on each thigh and bend forward from the hip, keeping spine straight. Allow hand on extended leg to reach toward toes. Support upper body with other arm. Hold _30__ seconds. Repeat _2__ times per session. Do __1_ sessions per day.  Copyright  VHI. All rights reserved.  Piriformis Stretch, Sitting    Sit, one ankle  on opposite knee, same-side hand on crossed knee. Push down on knee, keeping spine straight. Lean torso forward, with flat back, until tension is felt in hamstrings and gluteals of crossed-leg side. Hold _30__ seconds.  Repeat _2__ times per session. Do _1__ sessions per day.  Squat    Standing, squeeze pelvic floor and hold. Squat. Relax. Repeat _5__ times. Do __2_ times a day.  Copyright  VHI. All rights reserved.  DEVELOPMENTAL POSITION: Tall Kneeling to Half Kneeling    From tall kneeling position, squeeze pelvic floor, shift weight to one side. Bring opposite leg forward, place foot flat on surface. _10__ reps per set, _1__ sets per day, _1__ days per week Repeat with other leg.   Copyright  VHI. All rights reserved.  DEVELOPMENTAL POSITION: Tall Kneeling    Start with buttocks touching heels. Lift chest and bring hips forward. Squeeze glutes and pelvic floor _10__ reps per set, _1__ sets per day, __1_ days per week Stay in tall kneeling with diagonal arm movement 10x each way Copyright  VHI. All rights reserved.  [facility] [address] [place], [place] [address] Phone # [phone] Fax [phone]

## 2018-09-04 NOTE — Therapy (Signed)
[facility] [address], [place], [place], [address] Phone: [phone]   Fax:  [phone]  Physical Therapy Treatment  Patient Details  Name: Shawn Hicks MRN: [id number] Date of Birth: Jul 04, 1966 Referring Provider (PT): Dr. Carmelina Paddock   Encounter Date: 09/04/2018  PT End of Session - 09/04/18 1109    Visit Number  9    Date for PT Re-Evaluation  10/28/18    Authorization Type  [employer]    PT Start Time  1100    PT Stop Time  1140    PT Time Calculation (min)  40 min    Activity Tolerance  Patient tolerated treatment well    Behavior During Therapy  [facility] for tasks assessed/performed       Past Medical History:  Diagnosis Date  . Back pain   . Bilateral medial epicondylitis of elbow joint   . Boil    under arm right  . Chest pain   . Depression   . Diabetes mellitus without complication ([place])   . ED (erectile dysfunction)   . Elevated PSA   . GERD (gastroesophageal reflux disease)   . Gout   . Hyperlipidemia   . Hypertension   . Migraine    Occular  . Obesity   . OSA on CPAP   . Palpitations   . Pneumonia    Hospitalized childhood  . Prostate cancer ([place])   . Tobacco abuse     Past Surgical History:  Procedure Laterality Date  . DENTAL SURGERY     Wisdom teeth  . LYMPHADENECTOMY N/A 01/19/2018   Procedure: BILATERAL LYMPHADENECTOMY;  Surgeon: Ardis Hughs, MD;  Location: [facility];  Service: Urology;  Laterality: N/A;  . PROSTATE BIOPSY    . ROBOT ASSISTED LAPAROSCOPIC RADICAL PROSTATECTOMY N/A 01/19/2018   Procedure: XI ROBOTIC ASSISTED LAPAROSCOPIC RADICAL PROSTATECTOMY;  Surgeon: Ardis Hughs, MD;  Location: [facility];  Service: Urology;  Laterality: N/A;    There were no vitals filed for this visit.  Subjective Assessment - 09/04/18 1107    Subjective  I had to cancel due to fungal yeast infection in the penile area. I am now having problems with leaking  with straining  and pushing something. Patient has increased control of urine.     Patient Stated Goals  decrease pain and decrease urinary leakage    Currently in Pain?  No/denies    Multiple Pain Sites  No         [facility] PT Assessment - 09/04/18 0001      Assessment   Medical Diagnosis  C61Prostate cancer    Referring Provider (PT)  Dr. Carmelina Paddock    Onset Date/Surgical Date  01/19/18    Prior Therapy  None      Precautions   Precautions  Other (comment)    Precaution Comments  cancer      Restrictions   Weight Bearing Restrictions  No      [place] residence      Prior Function   Level of Independence  Independent    Vocation  Full time employment    Vocation Requirements  standing, lifting, climbing, on knees    Leisure  walking      Cognition   Overall Cognitive Status  Within Functional Limits for tasks assessed      Posture/Postural Control   Posture/Postural Control  No significant limitations      Strength  Overall Strength Comments  abdominal strength 2/5    Right Hip Extension  5/5    Right Hip ABduction  4+/5    Left Hip Extension  5/5    Left Hip ABduction  4+/5                Pelvic Floor Special Questions - 09/04/18 0001    Urinary Leakage  Yes    Pad use  2-4 times depends    Activities that cause leaking  Lifting;Bending   on knees       [facility] Adult PT Treatment/Exercise - 09/04/18 0001      Exercises   Exercises  Lumbar      Lumbar Exercises: Stretches   Active Hamstring Stretch  Right;Left;2 reps;30 seconds   sitting   Lower Trunk Rotation  2 reps;30 seconds    Quadruped Mid Back Stretch  3 reps;30 seconds    Quadruped Mid Back Stretch Limitations  all three directions    Piriformis Stretch  Right;Left;2 reps;30 seconds   sitting     Lumbar Exercises: Standing   Other Standing Lumbar Exercises  tall kneel to kneeling with pelvic floor contraction, tall kneel to 1/2 kneel, tall knee with  diagonal with pelvic floor contraction      Lumbar Exercises: Quadruped   [place]  15 reps             PT Education - 09/04/18 1125    Education provided  Yes    Education Details  pelvic floor contraction with exercise; back stretches    Person(s) Educated  Patient    Methods  Explanation;Demonstration;Verbal cues;Handout    Comprehension  Returned demonstration;Verbalized understanding       PT Short Term Goals - 03/12/18 1018      PT SHORT TERM GOAL #1   Title  understand how to perform scar massage to reduce pain    Time  4    Period  Weeks    Status  Achieved      PT SHORT TERM GOAL #2   Title  ability to perform a pelvic floor contraction to work on decreasing reduction of urinary leakage    Period  Weeks    Status  Achieved      PT SHORT TERM GOAL #3   Title  understand ways to reduce urinary leakage with penile clamp and articuff    Time  4    Period  Weeks    Status  Achieved      PT SHORT TERM GOAL #4   Title  understand correct toileting technique to reduce strain on the pelvic floor    Time  4    Period  Weeks    Status  Achieved        PT Long Term Goals - 09/04/18 1114      PT LONG TERM GOAL #1   Title  independent with HEP and understand how to progress his program    Baseline  still learning    Time  12    Period  Weeks    Status  On-going      PT LONG TERM GOAL #2   Title  ability to hold a pelvic floor contraction for 10 seconds to reduce urinary leakage >/= 75%    Baseline  leakage improved by 40%    Time  12    Period  Weeks    Status  On-going      PT LONG TERM GOAL #3  Title  urinary leakage during the night decreased >/= 75% due to improve strength and endurance of pelvic floor muscles    Baseline  80% better    Time  12    Period  Weeks    Status  Achieved      PT LONG TERM GOAL #4   Title  ability to go from sit to  stand with urinary leakage decreased </- 50% due to not holding breath and pushing on the  pelvic floor contraction    Baseline  80% better    Time  12    Period  Weeks    Status  Achieved      PT LONG TERM GOAL #5   Title  urianry leakage with lifting, pushing motion, and working on his knees to perform work task decreased >/= 80%    Time  8    Period  Weeks    Status  New    Target Date  10/28/18            Plan - 09/04/18 1113    Clinical Impression Statement  Patient is wearing 2-4 depends per day depending on his work day. Patient abdominal strength is 2/5. Patient bilateral hip abductor strength is 4+/5. Patient will leak urine with lifting, transitional movement from the floor to standing, and kneeling and performing work tasks. Patient  reports his urinary leakage has improved by 80% with night time and going up and down from chair. Patient is able to control when he has to go to the bathroom. Patient will benefit from skilled therapy to further increase strength of the pelvic floor with demanding tasks at work to reduce leakage and having to wear pads.     Rehab Potential  Excellent    Clinical Impairments Affecting Rehab Potential  s/p prostectomy 01/19/2018; Diabetic; Back pain, Hypertension    PT Frequency  1x / week    PT Duration  8 weeks    PT Treatment/Interventions  Biofeedback;Moist Heat;Therapeutic exercise;Therapeutic activities;Functional mobility training;Neuromuscular re-education;Patient/family education;Scar mobilization;Manual techniques;Passive range of motion;Energy conservation    PT Next Visit Plan  pelvic floor EMG with standing and siting tasks;; work task    Oncologist with Plan of Care  Patient       Patient will benefit from skilled therapeutic intervention in order to improve the following deficits and impairments:  Pain, Decreased strength, Decreased scar mobility, Decreased coordination  Visit Diagnosis: Muscle weakness (generalized) - Plan: PT plan of care cert/re-cert  Unspecified lack of coordination - Plan: PT plan of  care cert/re-cert  Aftercare following surgery for neoplasm - Plan: PT plan of care cert/re-cert  Prostate cancer [facility]) - Plan: PT plan of care cert/re-cert     Problem List Patient Active Problem List   Diagnosis Date Noted  . Prostate cancer ([place]) 01/19/2018  . Chest pain, rule out acute myocardial infarction 01/16/2018  . HLD (hyperlipidemia) 01/16/2018  . Malignant neoplasm of prostate ([place]) 12/12/2017  . 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 Nocturnal hypoxemia due to emphysema ([place]) 05/08/2017  . Snorings 11/10/2014  . Severe obesity (BMI >= 40) ([place]) 11/10/2014  . Excessive daytime sleepiness 11/10/2014  . Sleep disorder, circadian, shift work type 11/10/2014  . Hypertension   . Chest pain 08/27/2014  . Tobacco abuse 08/27/2014  . Type 2 diabetes mellitus ([place]) 08/27/2014  . Essential hypertension 08/27/2014  . Palpitations 07/31/2013  . Obesity 07/31/2013  . Hyposmolality and/or hyponatremia 07/31/2013    Earlie Counts, PT  09/04/18 11:46 AM   [facility] [address] [place], [place], [address] Phone: [phone]   Fax:  [phone]  Name: HAARIS METALLO MRN: [id number] Date of Birth: September 30, 1965

## 2018-09-25 ENCOUNTER — Telehealth: Payer: Self-pay | Admitting: Physical Therapy

## 2018-09-25 ENCOUNTER — Ambulatory Visit: Payer: 59 | Attending: Urology | Admitting: Physical Therapy

## 2018-09-25 DIAGNOSIS — M6281 Muscle weakness (generalized): Secondary | ICD-10-CM | POA: Insufficient documentation

## 2018-09-25 DIAGNOSIS — C61 Malignant neoplasm of prostate: Secondary | ICD-10-CM | POA: Insufficient documentation

## 2018-09-25 DIAGNOSIS — R279 Unspecified lack of coordination: Secondary | ICD-10-CM | POA: Insufficient documentation

## 2018-09-25 DIAGNOSIS — Z483 Aftercare following surgery for neoplasm: Secondary | ICD-10-CM | POA: Insufficient documentation

## 2018-09-25 NOTE — Telephone Encounter (Signed)
Called patient about his 11:00 appointment that he missed. Left a message.  Earlie Counts, PT @1 /03/2019@ 11:36 AM

## 2018-09-26 ENCOUNTER — Encounter: Payer: Self-pay | Admitting: Physical Therapy

## 2018-09-26 ENCOUNTER — Ambulatory Visit: Payer: 59 | Admitting: Physical Therapy

## 2018-09-26 DIAGNOSIS — R279 Unspecified lack of coordination: Secondary | ICD-10-CM | POA: Diagnosis present

## 2018-09-26 DIAGNOSIS — M6281 Muscle weakness (generalized): Secondary | ICD-10-CM | POA: Diagnosis present

## 2018-09-26 DIAGNOSIS — Z483 Aftercare following surgery for neoplasm: Secondary | ICD-10-CM | POA: Diagnosis present

## 2018-09-26 DIAGNOSIS — C61 Malignant neoplasm of prostate: Secondary | ICD-10-CM

## 2018-09-26 NOTE — Therapy (Signed)
[facility] [address] [place], [place], [address] Phone: [phone]   Fax:  [phone]  Physical Therapy Treatment  Patient Details  Name: Shawn Hicks MRN: [id number] Date of Birth: [birth year] Referring Provider (PT): Dr. Carmelina Paddock   Encounter Date: 09/26/2018  PT End of Session - 09/26/18 1223    Visit Number  10    Date for PT Re-Evaluation  10/28/18    Authorization Type  [employer]    PT Start Time  1225    PT Stop Time  1310    PT Time Calculation (min)  45 min    Activity Tolerance  Patient tolerated treatment well    Behavior During Therapy  [facility] for tasks assessed/performed       Past Medical History:  Diagnosis Date  . Back pain   . Bilateral medial epicondylitis of elbow joint   . Boil    under arm right  . Chest pain   . Depression   . Diabetes mellitus without complication ([place])   . ED (erectile dysfunction)   . Elevated PSA   . GERD (gastroesophageal reflux disease)   . Gout   . Hyperlipidemia   . Hypertension   . Migraine    Occular  . Obesity   . OSA on CPAP   . Palpitations   . Pneumonia    Hospitalized childhood  . Prostate cancer ([place])   . Tobacco abuse     Past Surgical History:  Procedure Laterality Date  . DENTAL SURGERY     Wisdom teeth  . LYMPHADENECTOMY N/A 01/19/2018   Procedure: BILATERAL LYMPHADENECTOMY;  Surgeon: Ardis Hughs, MD;  [facility];  Service: Urology;  Laterality: N/A;  . PROSTATE BIOPSY    . ROBOT ASSISTED LAPAROSCOPIC RADICAL PROSTATECTOMY N/A 01/19/2018   Procedure: XI ROBOTIC ASSISTED LAPAROSCOPIC RADICAL PROSTATECTOMY;  Surgeon: Ardis Hughs, MD;  [facility];  Service: Urology;  Laterality: N/A;    There were no vitals filed for this visit.  Subjective Assessment - 09/26/18 1225    Subjective  The progress is fair. I have not been diligent with my exercises due to the holidays. I am done wtih fungal yeast  infection. Leak urine with straining.     Patient Stated Goals  decrease pain and decrease urinary leakage    Currently in Pain?  No/denies                    Pelvic Floor Special Questions - 09/26/18 0001    Biofeedback  sit isolating the urethra sphincter at 20 uv for 5 sec, kneel to tall kneel with breathing and contraction, tall kneel hold 5 sec, tall knee move trunk side to side; stand to tall knee, tall kneel with work faciliatated tasks and engaging the muscle, pelvic floor contraction in tall kneel while pushing a mat    Biofeedback sensor type  Surface   rectal       [facility] Adult PT Treatment/Exercise - 09/26/18 0001      Self-Care   Self-Care  Other Self-Care Comments    Other Self-Care Comments   where to order the pelvic floor muscle stimulator             PT Education - 09/26/18 1314    Education provided  Yes    Education Details  information on home electrical stimulator and anal probe    Person(s) Educated  Patient    Methods  Explanation;Demonstration;Verbal  cues;Handout    Comprehension  Returned demonstration;Verbalized understanding       PT Short Term Goals - 03/12/18 1018      PT SHORT TERM GOAL #1   Title  understand how to perform scar massage to reduce pain    Time  4    Period  Weeks    Status  Achieved      PT SHORT TERM GOAL #2   Title  ability to perform a pelvic floor contraction to work on decreasing reduction of urinary leakage    Period  Weeks    Status  Achieved      PT SHORT TERM GOAL #3   Title  understand ways to reduce urinary leakage with penile clamp and articuff    Time  4    Period  Weeks    Status  Achieved      PT SHORT TERM GOAL #4   Title  understand correct toileting technique to reduce strain on the pelvic floor    Time  4    Period  Weeks    Status  Achieved        PT Long Term Goals - 09/04/18 1114      PT LONG TERM GOAL #1   Title  independent with HEP and understand how to progress his  program    Baseline  still learning    Time  12    Period  Weeks    Status  On-going      PT LONG TERM GOAL #2   Title  ability to hold a pelvic floor contraction for 10 seconds to reduce urinary leakage >/= 75%    Baseline  leakage improved by 40%    Time  12    Period  Weeks    Status  On-going      PT LONG TERM GOAL #3   Title  urinary leakage during the night decreased >/= 75% due to improve strength and endurance of pelvic floor muscles    Baseline  80% better    Time  12    Period  Weeks    Status  Achieved      PT LONG TERM GOAL #4   Title  ability to go from sit to  stand with urinary leakage decreased </- 50% due to not holding breath and pushing on the pelvic floor contraction    Baseline  80% better    Time  12    Period  Weeks    Status  Achieved      PT LONG TERM GOAL #5   Title  urianry leakage with lifting, pushing motion, and working on his knees to perform work task decreased >/= 80%    Time  8    Period  Weeks    Status  New    Target Date  10/28/18            Plan - 09/26/18 1224    Clinical Impression Statement  Patient reports her urinary leakage is small amounts instead of gushes. Patient reports he was not able to exercise as much due to the holidays. Patient understands he needs to breath and contract he abdominals to work on continence. Patient will leak more with exertion. Patient will benefit from skilled therapy to further increase strength of the pelvic floor with demanding tasks at work to reduce leakage and having to wear pads.     Rehab Potential  Excellent    Clinical Impairments Affecting Rehab Potential  s/p prostectomy 01/19/2018; Diabetic; Back pain, Hypertension    PT Frequency  1x / week    PT Duration  8 weeks    PT Treatment/Interventions  Biofeedback;Moist Heat;Therapeutic exercise;Therapeutic activities;Functional mobility training;Neuromuscular re-education;Patient/family education;Scar mobilization;Manual techniques;Passive  range of motion;Energy conservation    PT Next Visit Plan  educate on stimulation device    PT Home Exercise Plan  progress everytime he is seen; added LE marching in standing    Consulted and Agree with Plan of Care  Patient       Patient will benefit from skilled therapeutic intervention in order to improve the following deficits and impairments:  Pain, Decreased strength, Decreased scar mobility, Decreased coordination  Visit Diagnosis: Muscle weakness (generalized)  Unspecified lack of coordination  Aftercare following surgery for neoplasm  Prostate cancer [facility])     Problem List Patient Active Problem List   Diagnosis Date Noted  . Prostate cancer ([place]) 01/19/2018  . Chest pain, rule out acute myocardial infarction 01/16/2018  . HLD (hyperlipidemia) 01/16/2018  . Malignant neoplasm of prostate ([place]) 12/12/2017  . 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 Nocturnal hypoxemia due to emphysema ([place]) 05/08/2017  . Snorings 11/10/2014  . Severe obesity (BMI >= 40) ([place]) 11/10/2014  . Excessive daytime sleepiness 11/10/2014  . Sleep disorder, circadian, shift work type 11/10/2014  . Hypertension   . Chest pain 08/27/2014  . Tobacco abuse 08/27/2014  . Type 2 diabetes mellitus ([place]) 08/27/2014  . Essential hypertension 08/27/2014  . Palpitations 07/31/2013  . Obesity 07/31/2013  . Hyposmolality and/or hyponatremia 07/31/2013    Earlie Counts, PT 09/26/18 1:23 PM   [facility] [address] [place], [place], [address] Phone: [phone]   Fax:  [phone]  Name: Shawn Hicks MRN: [id number] Date of Birth: 10-Nov-1965

## 2018-09-26 NOTE — Patient Instructions (Addendum)
   [facility] [address], [address] [place], [place] [address] Phone # [phone] Fax [phone]  

## 2018-10-08 ENCOUNTER — Telehealth: Payer: Self-pay | Admitting: Physical Therapy

## 2018-10-08 ENCOUNTER — Ambulatory Visit: Payer: 59 | Admitting: Physical Therapy

## 2018-10-08 NOTE — Telephone Encounter (Signed)
Called patient about missing his visit today at 11:00. Left message.  Earlie Counts, PT @1 /20/2020@ 11:20 AM

## 2018-10-22 ENCOUNTER — Ambulatory Visit: Payer: 59 | Attending: Urology | Admitting: Physical Therapy

## 2018-10-22 ENCOUNTER — Encounter: Payer: Self-pay | Admitting: Physical Therapy

## 2018-10-22 DIAGNOSIS — R279 Unspecified lack of coordination: Secondary | ICD-10-CM | POA: Diagnosis present

## 2018-10-22 DIAGNOSIS — Z483 Aftercare following surgery for neoplasm: Secondary | ICD-10-CM | POA: Insufficient documentation

## 2018-10-22 DIAGNOSIS — M6281 Muscle weakness (generalized): Secondary | ICD-10-CM | POA: Insufficient documentation

## 2018-10-22 DIAGNOSIS — C61 Malignant neoplasm of prostate: Secondary | ICD-10-CM | POA: Diagnosis present

## 2018-10-22 NOTE — Therapy (Addendum)
[facility] [address] [place], [place], [address] Phone: [phone]   Fax:  [phone]  Physical Therapy Treatment  Patient Details  Name: Shawn Hicks MRN: [id number] Date of Birth: Apr 10, 1966 Referring Provider (PT): Dr. Carmelina Paddock   Encounter Date: 10/22/2018  PT End of Session - 10/22/18 1709    Visit Number  11    Date for PT Re-Evaluation  04/22/19    Authorization Type  [employer]    PT Start Time  1100    PT Stop Time  1145    PT Time Calculation (min)  45 min    Activity Tolerance  Patient tolerated treatment well    Behavior During Therapy  [facility] for tasks assessed/performed       Past Medical History:  Diagnosis Date  . Back pain   . Bilateral medial epicondylitis of elbow joint   . Boil    under arm right  . Chest pain   . Depression   . Diabetes mellitus without complication ([place])   . ED (erectile dysfunction)   . Elevated PSA   . GERD (gastroesophageal reflux disease)   . Gout   . Hyperlipidemia   . Hypertension   . Migraine    Occular  . Obesity   . OSA on CPAP   . Palpitations   . Pneumonia    Hospitalized childhood  . Prostate cancer ([place])   . Tobacco abuse     Past Surgical History:  Procedure Laterality Date  . DENTAL SURGERY     Wisdom teeth  . LYMPHADENECTOMY N/A 01/19/2018   Procedure: BILATERAL LYMPHADENECTOMY;  Surgeon: Ardis Hughs, MD;  Location: [facility];  Service: Urology;  Laterality: N/A;  . PROSTATE BIOPSY    . ROBOT ASSISTED LAPAROSCOPIC RADICAL PROSTATECTOMY N/A 01/19/2018   Procedure: XI ROBOTIC ASSISTED LAPAROSCOPIC RADICAL PROSTATECTOMY;  Surgeon: Ardis Hughs, MD;  Location: [facility];  Service: Urology;  Laterality: N/A;    There were no vitals filed for this visit.  Subjective Assessment - 10/22/18 1103    Subjective  Leakage is getting better. Some nights I wake up dry and some nights I am not. I am contracting my pelvic floor more with  movements. I was in [facility] for 6 days and was not able to do my exercises.     Patient Stated Goals  decrease pain and decrease urinary leakage    Currently in Pain?  No/denies         [facility] PT Assessment - 10/22/18 0001      Assessment   Medical Diagnosis  C61Prostate cancer    Referring Provider (PT)  Dr. Carmelina Paddock    Onset Date/Surgical Date  01/19/18    Prior Therapy  None      Precautions   Precautions  Other (comment)    Precaution Comments  cancer      Restrictions   Weight Bearing Restrictions  No      [place] residence      Prior Function   Level of Independence  Independent    Vocation  Full time employment    Vocation Requirements  standing, lifting, climbing, on knees    Leisure  walking      Cognition   Overall Cognitive Status  Within Functional Limits for tasks assessed      Posture/Postural Control   Posture/Postural Control  No significant limitations  Strength   Overall Strength Comments  abdominal strength 3/5    Right Hip Extension  5/5    Right Hip ABduction  5/5    Left Hip Extension  5/5    Left Hip ABduction  5/5                   [facility] Adult PT Treatment/Exercise - 10/22/18 0001      Self-Care   Self-Care  Other Self-Care Comments    Other Self-Care Comments   reviewed the contraindications of electrical unit; instructed patient on how to use the sensor into the anal canal with some slippery stuff to ease into the canal, how to adjust the intensity of the sensor to cause a pelvic floor contraction for 10 seconds and relax for 5 seconds at 120 HZ and 50 mps      Modalities   Modalities  Electrical Stimulation      Electrical Stimulation   Electrical Stimulation Location  rectal area    Electrical Stimulation Action  EMS to anal canal    Electrical Stimulation Parameters  120 HZ, 50 mhz, 15 minutes    Electrical Stimulation Goals  Strength               PT  Short Term Goals - 03/12/18 1018      PT SHORT TERM GOAL #1   Title  understand how to perform scar massage to reduce pain    Time  4    Period  Weeks    Status  Achieved      PT SHORT TERM GOAL #2   Title  ability to perform a pelvic floor contraction to work on decreasing reduction of urinary leakage    Period  Weeks    Status  Achieved      PT SHORT TERM GOAL #3   Title  understand ways to reduce urinary leakage with penile clamp and articuff    Time  4    Period  Weeks    Status  Achieved      PT SHORT TERM GOAL #4   Title  understand correct toileting technique to reduce strain on the pelvic floor    Time  4    Period  Weeks    Status  Achieved        PT Long Term Goals - 09/04/18 1114      PT LONG TERM GOAL #1   Title  independent with HEP and understand how to progress his program    Baseline  still learning    Time  12    Period  Weeks    Status  On-going      PT LONG TERM GOAL #2   Title  ability to hold a pelvic floor contraction for 10 seconds to reduce urinary leakage >/= 75%    Baseline  leakage improved by 40%    Time  12    Period  Weeks    Status  On-going      PT LONG TERM GOAL #3   Title  urinary leakage during the night decreased >/= 75% due to improve strength and endurance of pelvic floor muscles    Baseline  80% better    Time  12    Period  Weeks    Status  Achieved      PT LONG TERM GOAL #4   Title  ability to go from sit to  stand with urinary leakage decreased </- 50% due to not  holding breath and pushing on the pelvic floor contraction    Baseline  80% better    Time  12    Period  Weeks    Status  Achieved      PT LONG TERM GOAL #5   Title  urianry leakage with lifting, pushing motion, and working on his knees to perform work task decreased >/= 80%    Time  8    Period  Weeks    Status  New    Target Date  10/28/18            Plan - 10/22/18 1709    Clinical Impression Statement  Patient came in with the EMS unit  with pelvic floor sensor to work on strengthening the muscles to reduce the urinary leakage. Patient understands how to use the unit with increasing the intensity for pelvic floor contraction for 15 minutes per day. Patient still is working on contracting the pelvic floor prior to movement and activity to reduce leakage. Patient is able to go through several nights without leakage. Patient still wears pads. Patient is still working no not holding his breath with contraction. Patient is able to contract the pelvic floor to 20 uv for 5 sec. Patient reports urinary leakage improved by 40%.  Urinary leakage at night improve by 80%.  Patient will benefit from skilled therapy to furthe increase strength of the pelvic floor with demanding tasks at work to reduce leakage and having to wear pads.     Rehab Potential  Excellent    Clinical Impairments Affecting Rehab Potential  s/p prostectomy 01/19/2018; Diabetic; Back pain, Hypertension    PT Frequency  1x / week    PT Duration  8 weeks    PT Treatment/Interventions  Biofeedback;Moist Heat;Therapeutic exercise;Therapeutic activities;Functional mobility training;Neuromuscular re-education;Patient/family education;Scar mobilization;Manual techniques;Passive range of motion;Energy conservation    PT Next Visit Plan   educate on the TENS portion of electrical device, pelvic floor contraction with transitional movements    PT Home Exercise Plan  progress everytime he is seen; added LE marching in standing    Consulted and Agree with Plan of Care  Patient       Patient will benefit from skilled therapeutic intervention in order to improve the following deficits and impairments:  Pain, Decreased strength, Decreased scar mobility, Decreased coordination  Visit Diagnosis: Muscle weakness (generalized) - Plan: PT plan of care cert/re-cert  Unspecified lack of coordination - Plan: PT plan of care cert/re-cert  Aftercare following surgery for neoplasm - Plan: PT plan  of care cert/re-cert  Prostate cancer [facility]) - Plan: PT plan of care cert/re-cert     Problem List Patient Active Problem List   Diagnosis Date Noted  . Prostate cancer ([place]) 01/19/2018  . Chest pain, rule out acute myocardial infarction 01/16/2018  . HLD (hyperlipidemia) 01/16/2018  . Malignant neoplasm of prostate ([place]) 12/12/2017  . 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 Snorings 11/10/2014  . Severe obesity (BMI >= 40) ([place]) 11/10/2014  . Excessive daytime sleepiness 11/10/2014  . Sleep disorder, circadian, shift work type 11/10/2014  . Hypertension   . Chest pain 08/27/2014  . Tobacco abuse 08/27/2014  . Type 2 diabetes mellitus ([place]) 08/27/2014  . Essential hypertension 08/27/2014  . Palpitations 07/31/2013  . Obesity 07/31/2013  . Hyposmolality and/or hyponatremia 07/31/2013    Earlie Counts, PT 10/22/18 5:20 PM   [facility] [address],  [place], [place], [address] Phone: [phone]   Fax:  [phone]  Name: TAGGART PRASAD MRN: [id number] Date of Birth: [birth year] PHYSICAL THERAPY DISCHARGE SUMMARY  Visits from Start of Care: 11  Current functional level related to goals / functional outcomes: See above. Patient has not attended therapy for 2 months due to COVID-19. Therapist has contacted patient to resume treatment but no return phone call.    Remaining deficits: See above.    Education / Equipment: HEP, electrical stimulation for the pelvic floor muscles to improve contraction.   Plan:                                                    Patient goals were partially met. Patient is being discharged due to not returning since the last visit.  Thank you for the referral. Earlie Counts, PT 01/24/19 2:28 PM  ?????

## 2018-11-05 ENCOUNTER — Encounter: Payer: 59 | Admitting: Physical Therapy

## 2018-11-19 ENCOUNTER — Encounter: Payer: 59 | Admitting: Physical Therapy

## 2018-12-06 ENCOUNTER — Telehealth: Payer: Self-pay | Admitting: Physical Therapy

## 2018-12-06 NOTE — Telephone Encounter (Signed)
Called patient about cancelling appointment on 12/10/2018 and left a message.  Earlie Counts, PT @3 /19/2020@ 5:37 PM

## 2018-12-10 ENCOUNTER — Ambulatory Visit: Payer: 59 | Admitting: Physical Therapy

## 2018-12-31 ENCOUNTER — Telehealth: Payer: Self-pay | Admitting: Physical Therapy

## 2018-12-31 NOTE — Telephone Encounter (Signed)
Spoke to patient. He is wearing 2 pads per day. Patient reports his leakage is decreased. He is not using the electrical stimulation regularly. He would like to return to therapy once everything with covid-19 calms down.  Shawn Hicks, PT @4 /13/2020@ 12:12 PM

## 2019-08-23 ENCOUNTER — Other Ambulatory Visit: Payer: Self-pay

## 2019-08-23 DIAGNOSIS — Z20822 Contact with and (suspected) exposure to covid-19: Secondary | ICD-10-CM

## 2019-08-25 LAB — NOVEL CORONAVIRUS, NAA: SARS-CoV-2, NAA: NOT DETECTED

## 2019-11-16 IMAGING — CR DG CHEST 2V
2 series · 2 of 2 positions shown · non-contrast
Comparison: Chest x-ray dated August 11, 2008.

CLINICAL DATA: Worsening chest pain over the past few days.

EXAM:
CHEST - 2 VIEW

[w chest pa]
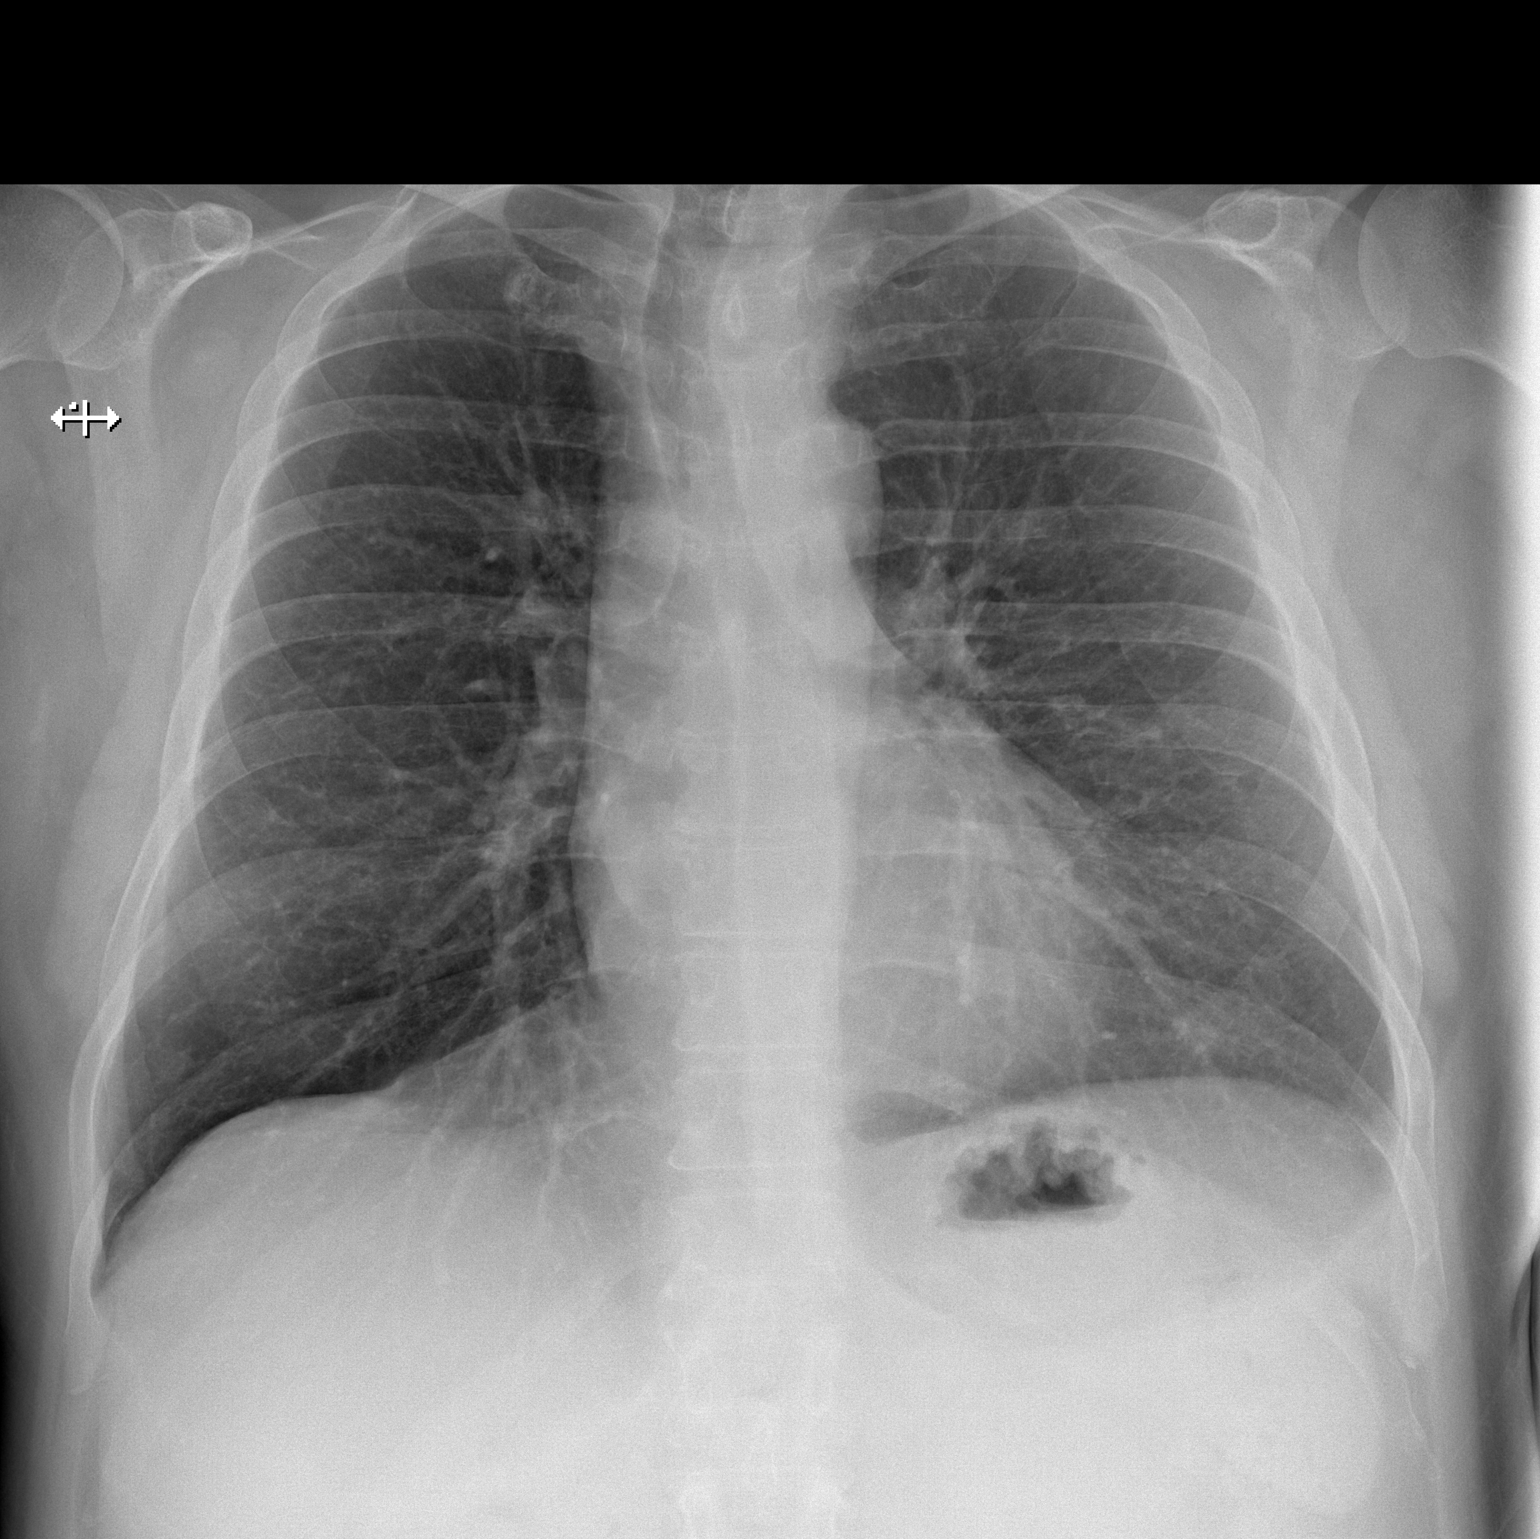

[w chest lat]
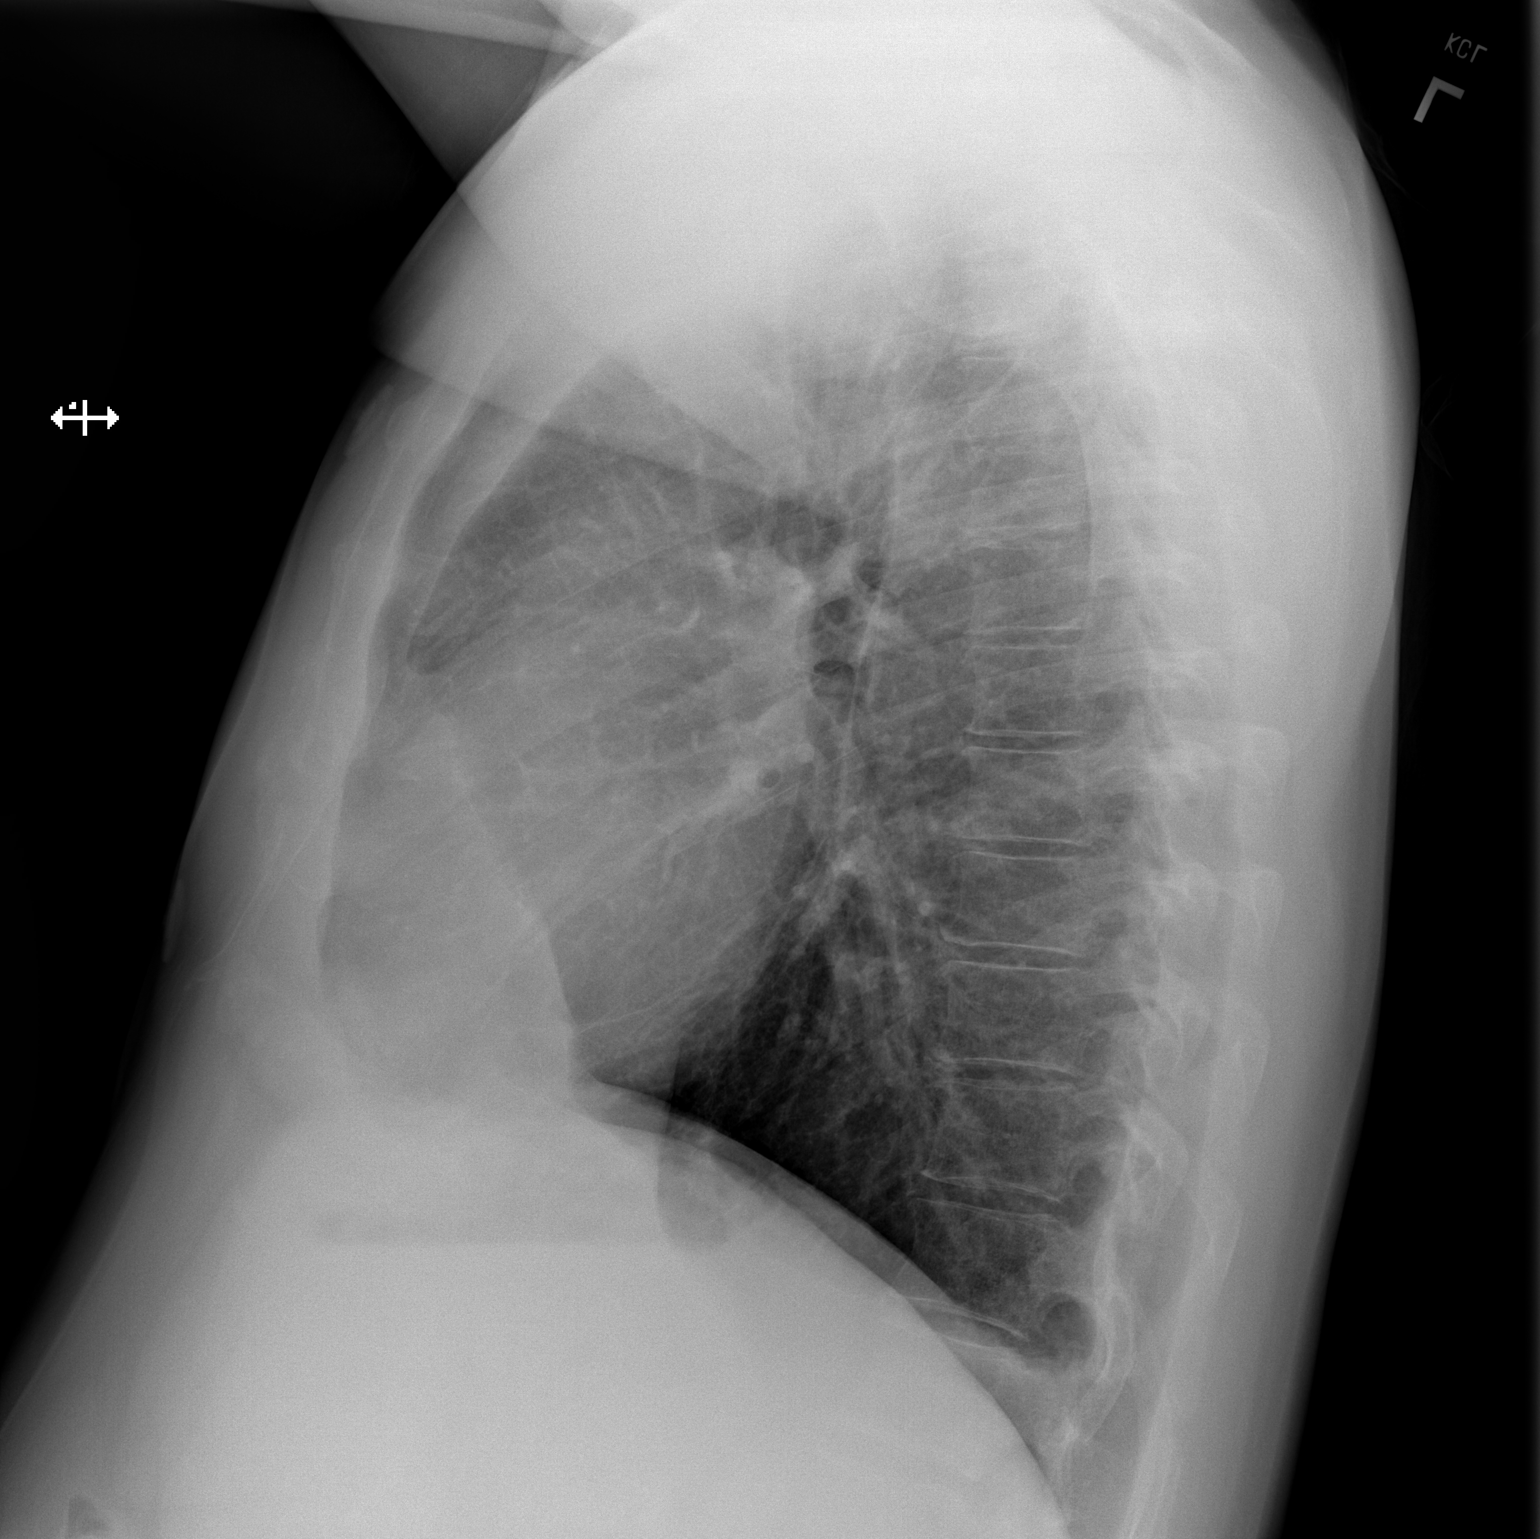

[2 of 2 positions shown; findings below may reference images not displayed]

FINDINGS: The heart size and mediastinal contours are within normal limits.
Normal pulmonary vascularity. No focal consolidation, pleural
effusion, or pneumothorax. No acute osseous abnormality.
IMPRESSION: No active cardiopulmonary disease.

## 2020-01-29 ENCOUNTER — Other Ambulatory Visit (HOSPITAL_COMMUNITY): Payer: Self-pay | Admitting: Internal Medicine

## 2020-01-29 ENCOUNTER — Other Ambulatory Visit: Payer: Self-pay

## 2020-01-29 ENCOUNTER — Ambulatory Visit (HOSPITAL_COMMUNITY)
Admission: RE | Admit: 2020-01-29 | Discharge: 2020-01-29 | Disposition: A | Discharge status: Home / Self Care | Payer: 59 | Source: Ambulatory Visit | Attending: Internal Medicine | Admitting: Internal Medicine

## 2020-01-29 DIAGNOSIS — R221 Localized swelling, mass and lump, neck: Secondary | ICD-10-CM | POA: Insufficient documentation

## 2020-01-29 DIAGNOSIS — R1310 Dysphagia, unspecified: Secondary | ICD-10-CM | POA: Diagnosis present

## 2020-02-04 ENCOUNTER — Other Ambulatory Visit: Payer: Self-pay | Admitting: Internal Medicine

## 2020-02-04 DIAGNOSIS — R221 Localized swelling, mass and lump, neck: Secondary | ICD-10-CM

## 2021-02-19 ENCOUNTER — Other Ambulatory Visit: Payer: Self-pay | Admitting: Internal Medicine

## 2021-02-19 DIAGNOSIS — F17209 Nicotine dependence, unspecified, with unspecified nicotine-induced disorders: Secondary | ICD-10-CM

## 2021-11-04 ENCOUNTER — Ambulatory Visit: Payer: 59

## 2021-11-29 IMAGING — US US SOFT TISSUE HEAD/NECK
1 series · 14 of 25 positions shown · non-contrast
Comparison: None.

CLINICAL DATA: 53-year-old male with neck mass and painful
swallowing.

EXAM:
ULTRASOUND OF HEAD/NECK SOFT TISSUES
TECHNIQUE: Ultrasound examination of the head and neck soft tissues was
performed in the area of clinical concern.

[Series 1: us soft tissue head/neck · 14 of 30 slices shown]
[im 1/30]
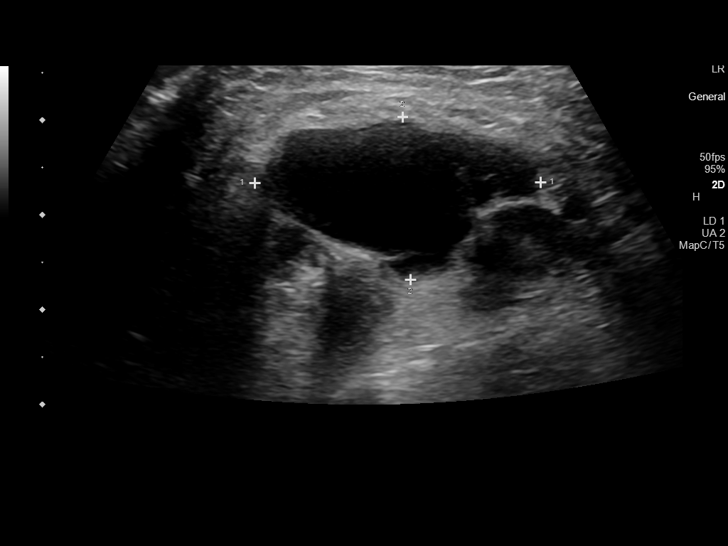
[im 3/30]
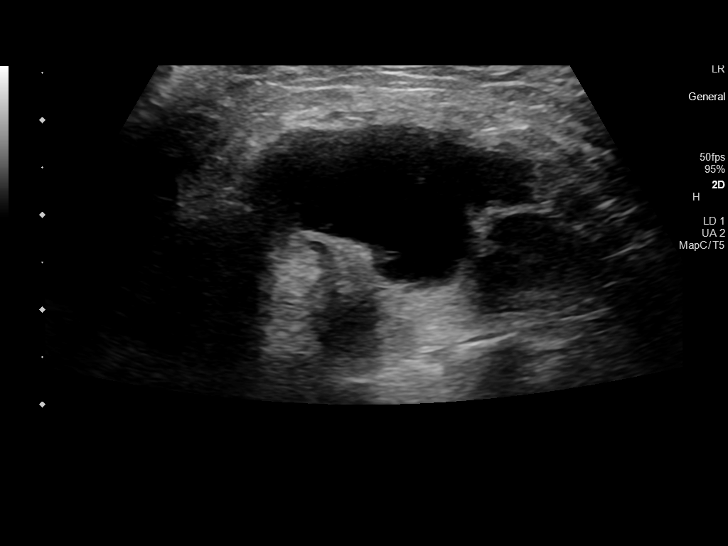
[im 5/30]
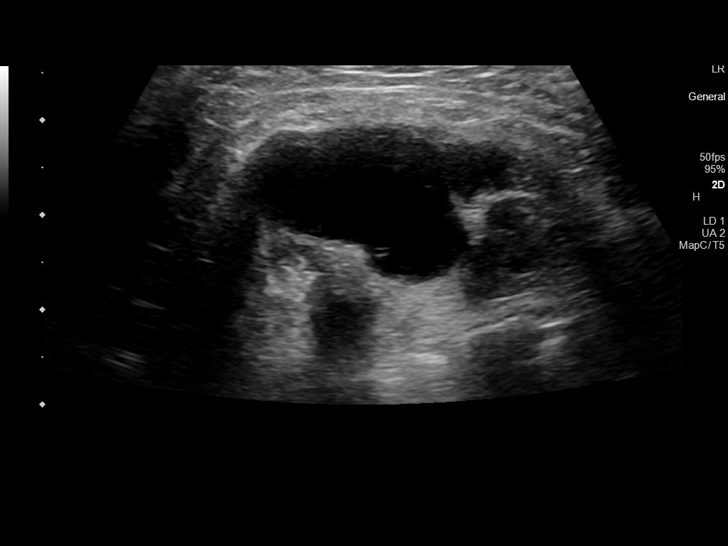
[im 8/30]
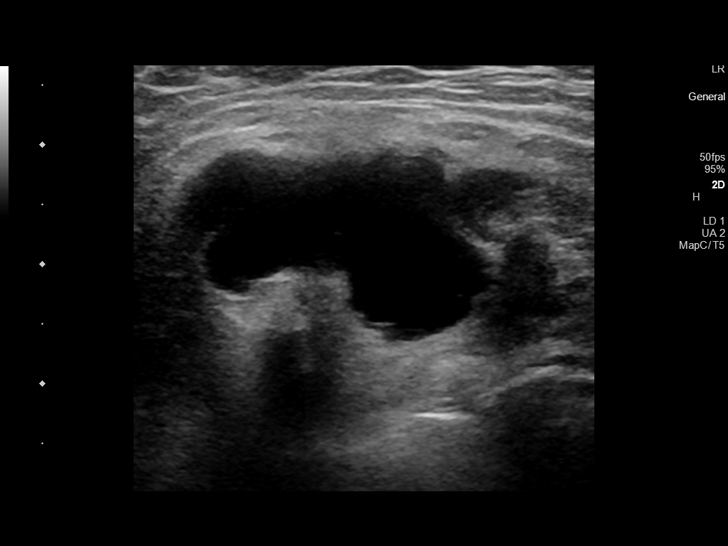
[im 10/30]
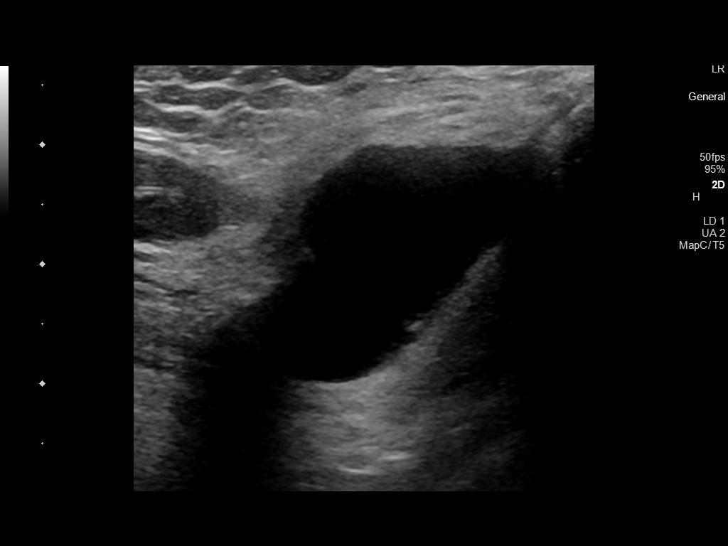
[im 11/30]
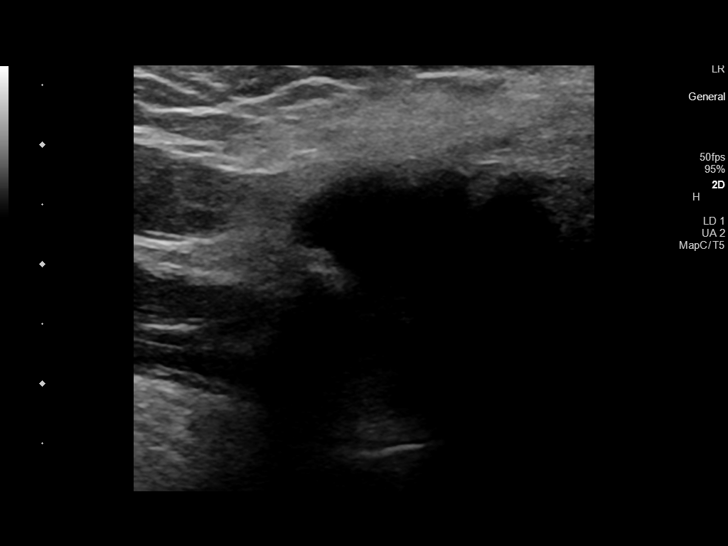
[im 14/30]
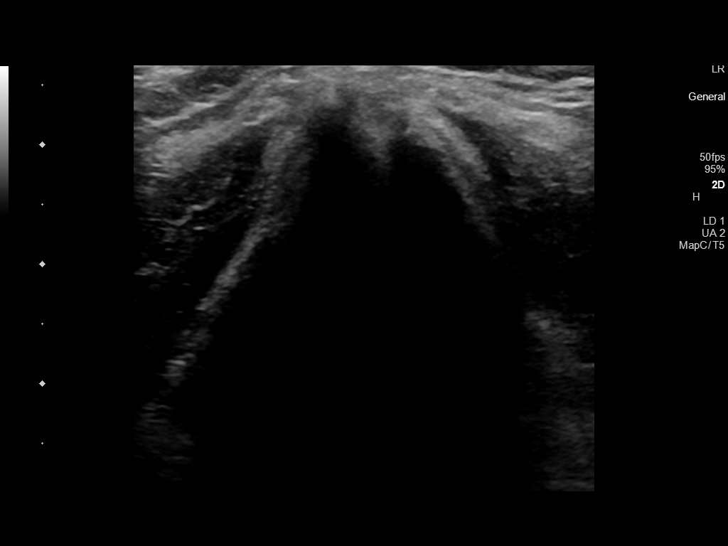
[im 16/30]
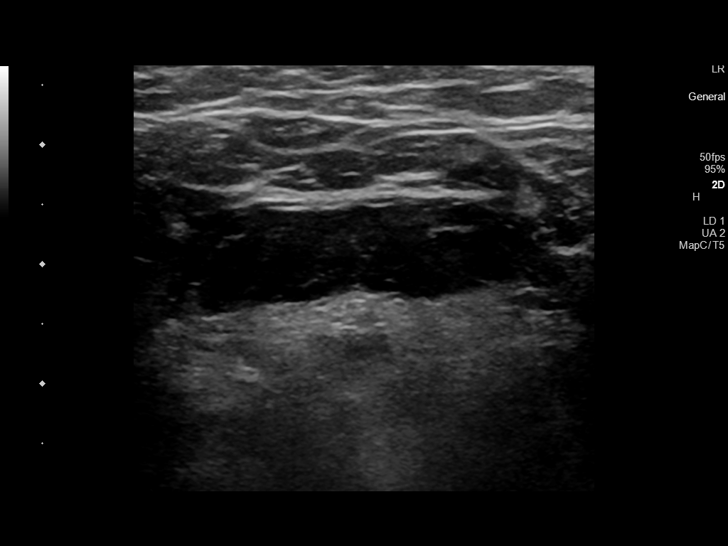
[im 19/30]
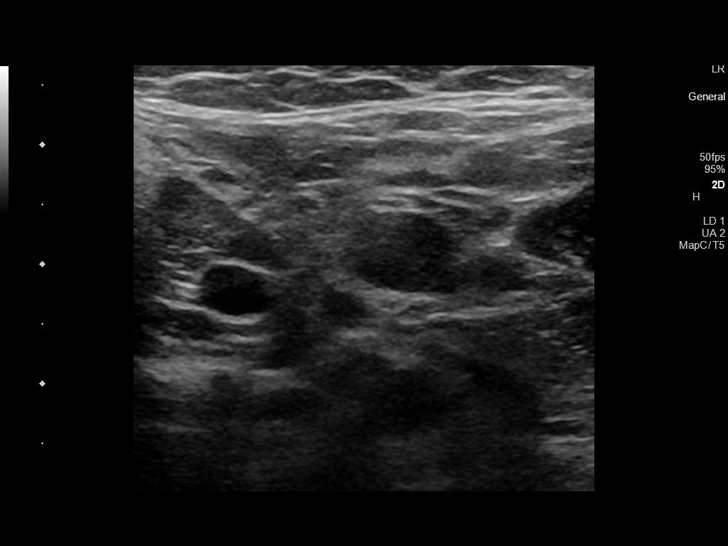
[im 20/30]
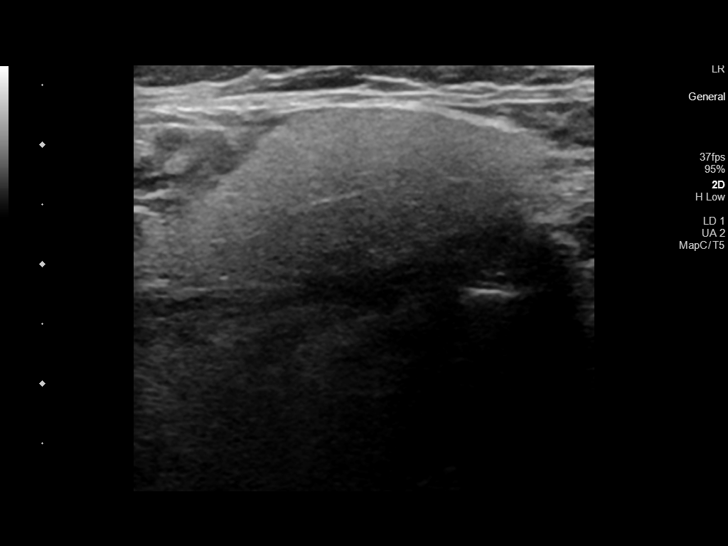
[im 22/30]
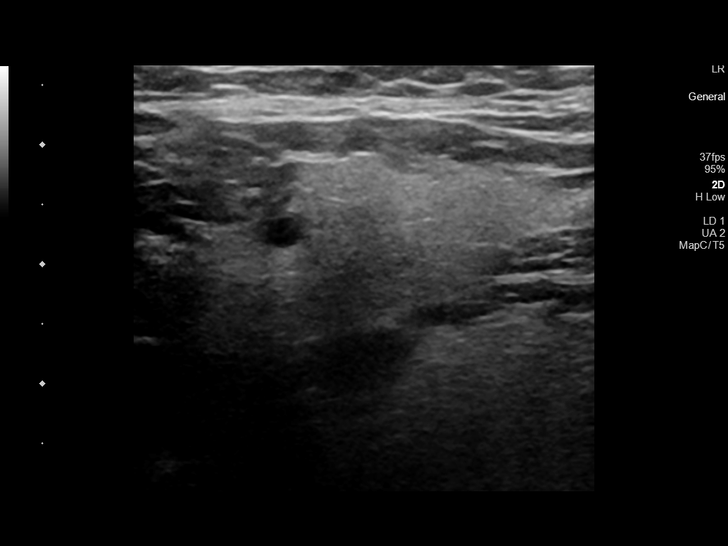
[im 25/30]
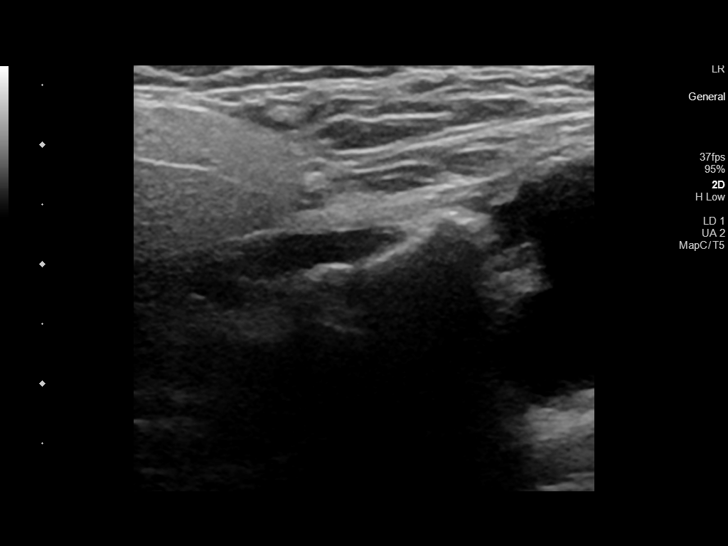
[im 27/30]
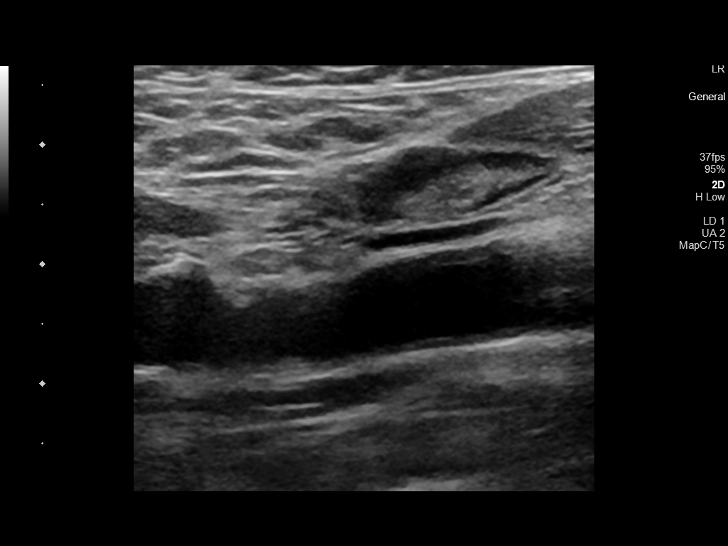
[im 30/30]
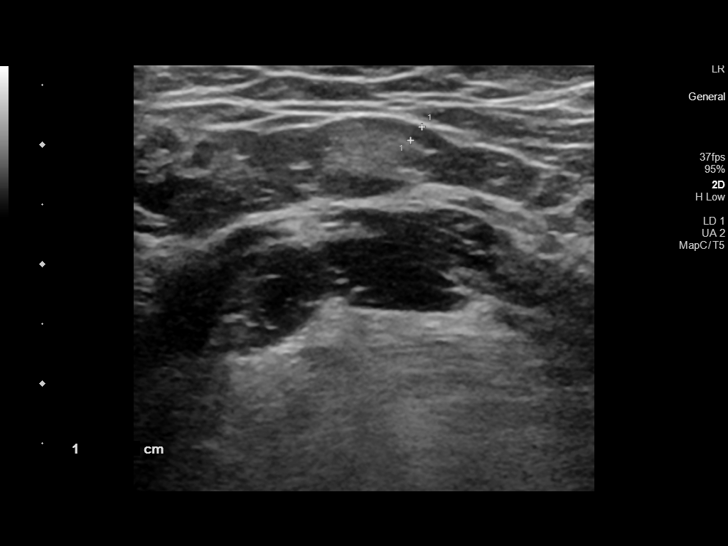

[14 of 25 positions shown; findings below may reference images not displayed]

FINDINGS: Grayscale and brief color Doppler images of a midline mass described
is superior to the thyroid reveal an irregular-shaped 2.8 x 3.1 x
1.7 cm lobulated but fairly simple appearing cystic mass (image 7)
with no vascular elements. Surrounding soft tissue architecture
appears to be normal. No lymphadenopathy, small physiologic lymph
nodes are identified. Symmetric appearing submandibular glands are
identified.

No images of the thyroid are included.
IMPRESSION: 1. Up to 3.1 cm lobulated but fairly simple appearing cystic mass in
the midline is most suggestive of a Thyroglossal Duct Cyst. Neck CT
(IV contrast preferred) should confirm and may be valuable for
treatment planning.
2. No regional lymphadenopathy or other soft tissue abnormality by
ultrasound.

## 2022-01-24 ENCOUNTER — Other Ambulatory Visit: Payer: Self-pay | Admitting: Internal Medicine

## 2022-01-24 DIAGNOSIS — E049 Nontoxic goiter, unspecified: Secondary | ICD-10-CM

## 2022-03-04 ENCOUNTER — Other Ambulatory Visit: Payer: Self-pay | Admitting: Internal Medicine

## 2022-03-04 DIAGNOSIS — E785 Hyperlipidemia, unspecified: Secondary | ICD-10-CM

## 2022-04-19 DIAGNOSIS — Z8546 Personal history of malignant neoplasm of prostate: Secondary | ICD-10-CM | POA: Diagnosis not present

## 2022-04-26 DIAGNOSIS — N5231 Erectile dysfunction following radical prostatectomy: Secondary | ICD-10-CM | POA: Diagnosis not present

## 2022-04-26 DIAGNOSIS — N393 Stress incontinence (female) (male): Secondary | ICD-10-CM | POA: Diagnosis not present

## 2022-04-26 DIAGNOSIS — N3942 Incontinence without sensory awareness: Secondary | ICD-10-CM | POA: Diagnosis not present

## 2022-07-25 ENCOUNTER — Other Ambulatory Visit: Payer: 59

## 2022-08-15 DIAGNOSIS — Z8546 Personal history of malignant neoplasm of prostate: Secondary | ICD-10-CM | POA: Diagnosis not present

## 2022-08-22 DIAGNOSIS — N3942 Incontinence without sensory awareness: Secondary | ICD-10-CM | POA: Diagnosis not present

## 2022-08-22 DIAGNOSIS — N5231 Erectile dysfunction following radical prostatectomy: Secondary | ICD-10-CM | POA: Diagnosis not present

## 2022-08-29 ENCOUNTER — Other Ambulatory Visit: Payer: 59

## 2022-09-26 ENCOUNTER — Ambulatory Visit
Admission: RE | Admit: 2022-09-26 | Discharge: 2022-09-26 | Disposition: A | Discharge status: Home / Self Care | Payer: No Typology Code available for payment source | Source: Ambulatory Visit | Attending: Internal Medicine | Admitting: Internal Medicine

## 2022-09-26 DIAGNOSIS — E785 Hyperlipidemia, unspecified: Secondary | ICD-10-CM

## 2022-10-07 ENCOUNTER — Other Ambulatory Visit (HOSPITAL_COMMUNITY): Payer: Self-pay | Admitting: Urology

## 2022-10-07 DIAGNOSIS — R9721 Rising PSA following treatment for malignant neoplasm of prostate: Secondary | ICD-10-CM

## 2022-11-15 DIAGNOSIS — I2510 Atherosclerotic heart disease of native coronary artery without angina pectoris: Secondary | ICD-10-CM | POA: Diagnosis not present

## 2022-11-15 DIAGNOSIS — E1169 Type 2 diabetes mellitus with other specified complication: Secondary | ICD-10-CM | POA: Diagnosis not present

## 2022-11-15 DIAGNOSIS — C61 Malignant neoplasm of prostate: Secondary | ICD-10-CM | POA: Diagnosis not present

## 2022-11-15 DIAGNOSIS — M8589 Other specified disorders of bone density and structure, multiple sites: Secondary | ICD-10-CM | POA: Diagnosis not present

## 2022-11-15 DIAGNOSIS — I700 Atherosclerosis of aorta: Secondary | ICD-10-CM | POA: Diagnosis not present

## 2022-11-15 DIAGNOSIS — I10 Essential (primary) hypertension: Secondary | ICD-10-CM | POA: Diagnosis not present

## 2022-12-27 DIAGNOSIS — Z8546 Personal history of malignant neoplasm of prostate: Secondary | ICD-10-CM | POA: Diagnosis not present

## 2023-01-02 ENCOUNTER — Other Ambulatory Visit (HOSPITAL_COMMUNITY): Payer: Self-pay | Admitting: Urology

## 2023-01-02 DIAGNOSIS — C61 Malignant neoplasm of prostate: Secondary | ICD-10-CM

## 2023-01-02 DIAGNOSIS — Z1211 Encounter for screening for malignant neoplasm of colon: Secondary | ICD-10-CM | POA: Diagnosis not present

## 2023-01-20 ENCOUNTER — Ambulatory Visit (HOSPITAL_COMMUNITY)
Admission: RE | Admit: 2023-01-20 | Discharge: 2023-01-20 | Disposition: A | Discharge status: Home / Self Care | Payer: BC Managed Care – PPO | Source: Ambulatory Visit | Attending: Urology

## 2023-01-20 DIAGNOSIS — C61 Malignant neoplasm of prostate: Secondary | ICD-10-CM | POA: Insufficient documentation

## 2023-01-20 MED ORDER — PIFLIFOLASTAT F 18 (PYLARIFY) INJECTION
9.0000 | Freq: Once | INTRAVENOUS | Status: AC
  Administered 2023-01-20: 9 via INTRAVENOUS

## 2023-02-03 DIAGNOSIS — N5231 Erectile dysfunction following radical prostatectomy: Secondary | ICD-10-CM | POA: Diagnosis not present

## 2023-02-03 DIAGNOSIS — N393 Stress incontinence (female) (male): Secondary | ICD-10-CM | POA: Diagnosis not present

## 2023-02-03 DIAGNOSIS — Z8546 Personal history of malignant neoplasm of prostate: Secondary | ICD-10-CM | POA: Diagnosis not present

## 2023-02-08 ENCOUNTER — Other Ambulatory Visit: Payer: Self-pay | Admitting: Urology

## 2023-02-08 DIAGNOSIS — Z8546 Personal history of malignant neoplasm of prostate: Secondary | ICD-10-CM

## 2023-03-20 DIAGNOSIS — R1319 Other dysphagia: Secondary | ICD-10-CM | POA: Diagnosis not present

## 2023-03-20 DIAGNOSIS — I10 Essential (primary) hypertension: Secondary | ICD-10-CM | POA: Diagnosis not present

## 2023-03-27 DIAGNOSIS — M9902 Segmental and somatic dysfunction of thoracic region: Secondary | ICD-10-CM | POA: Diagnosis not present

## 2023-03-27 DIAGNOSIS — M9904 Segmental and somatic dysfunction of sacral region: Secondary | ICD-10-CM | POA: Diagnosis not present

## 2023-03-27 DIAGNOSIS — M9901 Segmental and somatic dysfunction of cervical region: Secondary | ICD-10-CM | POA: Diagnosis not present

## 2023-03-27 DIAGNOSIS — M9903 Segmental and somatic dysfunction of lumbar region: Secondary | ICD-10-CM | POA: Diagnosis not present

## 2023-03-29 ENCOUNTER — Other Ambulatory Visit: Payer: Self-pay | Admitting: Endocrinology

## 2023-03-29 DIAGNOSIS — I10 Essential (primary) hypertension: Secondary | ICD-10-CM | POA: Diagnosis not present

## 2023-03-29 DIAGNOSIS — Q892 Congenital malformations of other endocrine glands: Secondary | ICD-10-CM | POA: Diagnosis not present

## 2023-03-29 DIAGNOSIS — E049 Nontoxic goiter, unspecified: Secondary | ICD-10-CM | POA: Diagnosis not present

## 2023-03-29 DIAGNOSIS — E1165 Type 2 diabetes mellitus with hyperglycemia: Secondary | ICD-10-CM | POA: Diagnosis not present

## 2023-04-14 ENCOUNTER — Other Ambulatory Visit: Payer: 59

## 2023-04-18 ENCOUNTER — Ambulatory Visit: Admission: RE | Admit: 2023-04-18 | Payer: BC Managed Care – PPO | Source: Ambulatory Visit

## 2023-04-18 DIAGNOSIS — E049 Nontoxic goiter, unspecified: Secondary | ICD-10-CM | POA: Diagnosis not present

## 2023-04-18 DIAGNOSIS — Q892 Congenital malformations of other endocrine glands: Secondary | ICD-10-CM

## 2023-04-18 DIAGNOSIS — E041 Nontoxic single thyroid nodule: Secondary | ICD-10-CM | POA: Diagnosis not present

## 2023-04-20 ENCOUNTER — Other Ambulatory Visit: Payer: Self-pay | Admitting: Endocrinology

## 2023-04-20 DIAGNOSIS — Q892 Congenital malformations of other endocrine glands: Secondary | ICD-10-CM

## 2023-04-25 ENCOUNTER — Institutional Professional Consult (permissible substitution) (INDEPENDENT_AMBULATORY_CARE_PROVIDER_SITE_OTHER): Payer: BC Managed Care – PPO | Admitting: Thoracic Surgery (Cardiothoracic Vascular Surgery)

## 2023-04-25 ENCOUNTER — Encounter: Payer: Self-pay | Admitting: Thoracic Surgery (Cardiothoracic Vascular Surgery)

## 2023-04-25 VITALS — BP 140/74 | HR 80 | Resp 20

## 2023-04-25 DIAGNOSIS — E049 Nontoxic goiter, unspecified: Secondary | ICD-10-CM

## 2023-04-25 NOTE — Progress Notes (Signed)
PCP is Rodrigo Ran, MD Referring Provider is Rodrigo Ran, MD  Chief Complaint  Patient presents with   mass thoracic inlet    PET Scan 01/20/23/ Thyroid U/S 04/18/23    HPI: Shawn Hicks is sent for consultation regarding a mass in the thoracic inlet.  Shawn Hicks is a 57 year old man with a past history of tobacco abuse, hypertension, hyperlipidemia, gout, type 2 diabetes without complication, obstructive sleep apnea, prostate cancer, and a goiter.  He has had issues with dysphagia dating back a couple of years.  Mostly this was the sensation of food sticking in his esophagus.  At 1 point recently he was having a dyne aphasia as well with pain even when swallowing liquids.  In May he had a PSMA PET because of an elevated PSA.  There was an incidental CT finding of an enlarged left lobe of the thyroid with a goiter extending into the thoracic inlet.  Currently his swallowing has improved.  It has tended to wax and wane over time.  No further odynophagia.  No change in appetite.  He has had some weight loss but that was intentional.  He did have an ultrasound of his thyroid which noted a nodule.  Biopsy is planned for that.  Past Medical History:  Diagnosis Date   Back pain    Bilateral medial epicondylitis of elbow joint    Boil    under arm right   Chest pain    Depression    Diabetes mellitus without complication ([facility])    ED (erectile dysfunction)    Elevated PSA    GERD (gastroesophageal reflux disease)    Gout    Hyperlipidemia    Hypertension    Migraine    Occular   Obesity    OSA on CPAP    Palpitations    Pneumonia    Hospitalized childhood   Prostate cancer ([facility])    Tobacco abuse     Past Surgical History:  Procedure Laterality Date   DENTAL SURGERY     Wisdom teeth   LYMPHADENECTOMY N/A 01/19/2018   Procedure: BILATERAL LYMPHADENECTOMY;  Surgeon: Crist Fat, MD;  Location: [facility];  Service: Urology;  Laterality: N/A;   PROSTATE BIOPSY     ROBOT  ASSISTED LAPAROSCOPIC RADICAL PROSTATECTOMY N/A 01/19/2018   Procedure: XI ROBOTIC ASSISTED LAPAROSCOPIC RADICAL PROSTATECTOMY;  Surgeon: Crist Fat, MD;  Location: [facility];  Service: Urology;  Laterality: N/A;    Family History  Problem Relation Age of Onset   Liver disease Mother    Cancer Father        prostate cancer   Heart attack Father    Sleep apnea Brother    Cancer Maternal Grandfather        lung cancer/smoker    Social History Social History   Tobacco Use   Smoking status: Former    Types: Cigars    Quit date: 11/18/2017    Years since quitting: 5.4   Smokeless tobacco: Never  Vaping Use   Vaping status: Every Day   Start date: 11/18/2017  Substance Use Topics   Alcohol use: Yes    Alcohol/week: 3.0 standard drinks of alcohol    Types: 3 Shots of liquor per week    Comment: 2-3 times a month.    Drug use: Not Currently    Current Outpatient Medications  Medication Sig Dispense Refill   atorvastatin (LIPITOR) 40 MG tablet Take 40 mg by mouth every evening.  colchicine 0.6 MG tablet Take 0.6 mg by mouth daily as needed (gout).      Dapagliflozin-Metformin HCl ER (XIGDUO XR) 06-999 MG TB24 Take 1 tablet by mouth every evening.      diclofenac sodium (VOLTAREN) 1 % GEL Apply 1 application topically 2 (two) times daily as needed (pain).     FLUoxetine (PROZAC) 10 MG tablet Take 10 mg by mouth daily.     lisinopril (PRINIVIL,ZESTRIL) 40 MG tablet Take 20 mg by mouth every evening.      neomycin-bacitracin-polymyxin (NEOSPORIN) ointment Apply 1 application topically as needed for wound care.     nitroGLYCERIN (NITROSTAT) 0.4 MG SL tablet Place 1 tablet (0.4 mg total) under the tongue every 5 (five) minutes as needed for chest pain. 25 tablet 3   omeprazole (PRILOSEC) 20 MG capsule Take 20 mg by mouth every evening.      sitaGLIPtin (JANUVIA) 100 MG tablet Take 100 mg by mouth daily.     sulfamethoxazole-trimethoprim (BACTRIM DS,SEPTRA DS) 800-160 MG tablet  Take 1 tablet by mouth 2 (two) times daily. Start taking one day prior to your appointment for your first follow-up and catheter removal.  Continue taking for three days. 6 tablet 0   Misc Natural Products (BLACK CHERRY CONCENTRATE PO) Take 1 tablet by mouth every evening.  (Patient not taking: Reported on 04/25/2023)     traMADol (ULTRAM) 50 MG tablet Take 1-2 tablets (50-100 mg total) by mouth every 6 (six) hours as needed for moderate pain. (Patient not taking: Reported on 04/25/2023) 30 tablet 0   TURMERIC PO Take 1 capsule by mouth every evening. (Patient not taking: Reported on 04/25/2023)     No current facility-administered medications for this visit.    No Known Allergies  Review of Systems  Constitutional:  Positive for unexpected weight change. Negative for activity change.  HENT:  Positive for dental problem, hearing loss and trouble swallowing. Negative for voice change.   Respiratory:  Positive for apnea. Negative for shortness of breath, wheezing and stridor.   Cardiovascular:  Positive for chest pain and leg swelling. Negative for palpitations.  Neurological:  Positive for numbness.  Hematological:  Negative for adenopathy. Does not bruise/bleed easily.  All other systems reviewed and are negative.   BP (!) 140/74   Pulse 80   Resp 20   SpO2 95% Comment: RA Physical Exam Vitals reviewed.  Constitutional:      General: He is not in acute distress.    Appearance: Normal appearance.  HENT:     Head: Normocephalic and atraumatic.  Eyes:     General: No scleral icterus.    Extraocular Movements: Extraocular movements intact.  Neck:     Comments: Thyromegaly but unable to palpate discrete nodule Cardiovascular:     Rate and Rhythm: Normal rate and regular rhythm.     Heart sounds: Normal heart sounds. No murmur heard. Pulmonary:     Effort: Pulmonary effort is normal. No respiratory distress.     Breath sounds: Normal breath sounds. No wheezing.  Abdominal:     General:  There is no distension.     Palpations: Abdomen is soft.  Skin:    General: Skin is warm and dry.  Neurological:     General: No focal deficit present.     Mental Status: He is alert and oriented to person, place, and time.     Cranial Nerves: No cranial nerve deficit.     Motor: No weakness.    Diagnostic Tests:  NUCLEAR MEDICINE PET SKULL BASE TO THIGH   TECHNIQUE: 9.7 mCi F18 Piflufolastat (Pylarify) was injected intravenously. Full-ring PET imaging was performed from the skull base to thigh after the radiotracer. CT data was obtained and used for attenuation correction and anatomic localization.   COMPARISON:  None Available.   FINDINGS: NECK   No radiotracer activity in neck lymph nodes.   Incidental CT finding: Enlargement of the LEFT lobe of thyroid gland which extends into the upper thoracic inlet along the aortic arch is most consistent benign goiter enlargement.   CHEST   No radiotracer accumulation within mediastinal or hilar lymph nodes. No suspicious pulmonary nodules on the CT scan.   Incidental CT finding: None.   ABDOMEN/PELVIS   Prostate: No focal activity in the prostate bed.   Lymph nodes: No abnormal radiotracer accumulation within pelvic or abdominal nodes.   Liver: No evidence of liver metastasis.   Incidental CT finding: Atherosclerotic calcification of the aorta.   SKELETON   Single focus of intense radiotracer activity localizes to the posteromedial aspect of the LEFT iliac bone with SUV max equal 10.0 on image 183. relatively small lesion measuring less than 10 mm in estimation. There is no CT correlation   No additional lytic or sclerotic lesions evidence in the skeleton. No additional radiotracer avid lesions.   IMPRESSION: 1. Solitary focus of intense radiotracer activity within the posteromedial LEFT iliac bone. Differential include oligometastasis prostate carcinoma versus CT occult benign bone lesion. Skeletal metastasis  unusual with PSA < 1. Consider follow-up surveillance or potentially MRI with without contrast to confirm presence of a lesion. 2. No evidence of local prostate cancer recurrence in the prostate bed. 3. No evidence of metastatic adenopathy in the pelvis or periaortic retroperitoneum. 4. No visceral metastasis or skeletal metastasis.     Electronically Signed   By: Genevive Bi M.D.   On: 01/23/2023 16:53 I personally reviewed the PET/CT images.  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  Impression: Shawn Hicks is a 57 year old man with a past history of tobacco abuse, hypertension, hyperlipidemia, gout, type 2 diabetes without complication, obstructive sleep apnea, prostate cancer, and a goiter.  Thyroid goiter-extends into mediastinum.  Some tracheal deviation.  Intermittent dysphagia, at times severe.  I think this should be resected.  In all likelihood this could be completely resected from a cervical approach.  Will refer to Dr. Darnell Level for [place] surgery as he has extensive experience in this area.  I would be happy to be on standby for possible assistance if needed, but these almost always can be completely resected from the neck.  He expressed reluctance to have surgery because he is not currently having symptoms.  I warned him that his symptoms are likely to recur if he does not have some type of intervention.  Plan: Refer to Dr. Darnell Level for consideration of resection.  Loreli Slot, MD [facility] [phone]

## 2023-05-11 DIAGNOSIS — M9901 Segmental and somatic dysfunction of cervical region: Secondary | ICD-10-CM | POA: Diagnosis not present

## 2023-05-11 DIAGNOSIS — M9904 Segmental and somatic dysfunction of sacral region: Secondary | ICD-10-CM | POA: Diagnosis not present

## 2023-05-11 DIAGNOSIS — M9903 Segmental and somatic dysfunction of lumbar region: Secondary | ICD-10-CM | POA: Diagnosis not present

## 2023-05-11 DIAGNOSIS — M9902 Segmental and somatic dysfunction of thoracic region: Secondary | ICD-10-CM | POA: Diagnosis not present

## 2023-05-16 ENCOUNTER — Ambulatory Visit
Admission: RE | Admit: 2023-05-16 | Discharge: 2023-05-16 | Disposition: A | Discharge status: Home / Self Care | Payer: BC Managed Care – PPO | Source: Ambulatory Visit | Attending: Endocrinology | Admitting: Endocrinology

## 2023-05-16 ENCOUNTER — Other Ambulatory Visit (HOSPITAL_COMMUNITY)
Admission: RE | Admit: 2023-05-16 | Discharge: 2023-05-16 | Disposition: A | Discharge status: Home / Self Care | Payer: BC Managed Care – PPO | Source: Ambulatory Visit | Attending: Interventional Radiology | Admitting: Interventional Radiology

## 2023-05-16 DIAGNOSIS — E041 Nontoxic single thyroid nodule: Secondary | ICD-10-CM

## 2023-05-16 DIAGNOSIS — Q892 Congenital malformations of other endocrine glands: Secondary | ICD-10-CM

## 2023-05-23 DIAGNOSIS — M9904 Segmental and somatic dysfunction of sacral region: Secondary | ICD-10-CM | POA: Diagnosis not present

## 2023-05-23 DIAGNOSIS — M9901 Segmental and somatic dysfunction of cervical region: Secondary | ICD-10-CM | POA: Diagnosis not present

## 2023-05-23 DIAGNOSIS — M9903 Segmental and somatic dysfunction of lumbar region: Secondary | ICD-10-CM | POA: Diagnosis not present

## 2023-05-23 DIAGNOSIS — M9902 Segmental and somatic dysfunction of thoracic region: Secondary | ICD-10-CM | POA: Diagnosis not present

## 2023-05-24 DIAGNOSIS — M9902 Segmental and somatic dysfunction of thoracic region: Secondary | ICD-10-CM | POA: Diagnosis not present

## 2023-05-24 DIAGNOSIS — M9901 Segmental and somatic dysfunction of cervical region: Secondary | ICD-10-CM | POA: Diagnosis not present

## 2023-05-24 DIAGNOSIS — M9903 Segmental and somatic dysfunction of lumbar region: Secondary | ICD-10-CM | POA: Diagnosis not present

## 2023-05-24 DIAGNOSIS — M9904 Segmental and somatic dysfunction of sacral region: Secondary | ICD-10-CM | POA: Diagnosis not present

## 2023-05-25 LAB — CYTOLOGY - NON PAP

## 2023-05-29 DIAGNOSIS — Z8546 Personal history of malignant neoplasm of prostate: Secondary | ICD-10-CM | POA: Diagnosis not present

## 2023-05-30 ENCOUNTER — Ambulatory Visit
Admission: RE | Admit: 2023-05-30 | Discharge: 2023-05-30 | Disposition: A | Discharge status: Home / Self Care | Payer: BC Managed Care – PPO | Source: Ambulatory Visit | Attending: Urology | Admitting: Urology

## 2023-05-30 DIAGNOSIS — Z8546 Personal history of malignant neoplasm of prostate: Secondary | ICD-10-CM | POA: Diagnosis not present

## 2023-05-30 MED ORDER — GADOPICLENOL 0.5 MMOL/ML IV SOLN
10.0000 mL | Freq: Once | INTRAVENOUS | Status: AC | PRN
  Administered 2023-05-30: 10 mL via INTRAVENOUS

## 2023-06-05 DIAGNOSIS — M9905 Segmental and somatic dysfunction of pelvic region: Secondary | ICD-10-CM | POA: Diagnosis not present

## 2023-06-05 DIAGNOSIS — M9904 Segmental and somatic dysfunction of sacral region: Secondary | ICD-10-CM | POA: Diagnosis not present

## 2023-06-05 DIAGNOSIS — N393 Stress incontinence (female) (male): Secondary | ICD-10-CM | POA: Diagnosis not present

## 2023-06-05 DIAGNOSIS — M9903 Segmental and somatic dysfunction of lumbar region: Secondary | ICD-10-CM | POA: Diagnosis not present

## 2023-06-05 DIAGNOSIS — M545 Low back pain, unspecified: Secondary | ICD-10-CM | POA: Diagnosis not present

## 2023-06-05 DIAGNOSIS — N5231 Erectile dysfunction following radical prostatectomy: Secondary | ICD-10-CM | POA: Diagnosis not present

## 2023-06-05 DIAGNOSIS — R9721 Rising PSA following treatment for malignant neoplasm of prostate: Secondary | ICD-10-CM | POA: Diagnosis not present

## 2023-06-07 DIAGNOSIS — Z1212 Encounter for screening for malignant neoplasm of rectum: Secondary | ICD-10-CM | POA: Diagnosis not present

## 2023-06-07 DIAGNOSIS — E049 Nontoxic goiter, unspecified: Secondary | ICD-10-CM | POA: Diagnosis not present

## 2023-06-07 DIAGNOSIS — E291 Testicular hypofunction: Secondary | ICD-10-CM | POA: Diagnosis not present

## 2023-06-07 DIAGNOSIS — I10 Essential (primary) hypertension: Secondary | ICD-10-CM | POA: Diagnosis not present

## 2023-06-07 DIAGNOSIS — M109 Gout, unspecified: Secondary | ICD-10-CM | POA: Diagnosis not present

## 2023-06-07 DIAGNOSIS — E1169 Type 2 diabetes mellitus with other specified complication: Secondary | ICD-10-CM | POA: Diagnosis not present

## 2023-06-07 DIAGNOSIS — C61 Malignant neoplasm of prostate: Secondary | ICD-10-CM | POA: Diagnosis not present

## 2023-06-08 DIAGNOSIS — R1310 Dysphagia, unspecified: Secondary | ICD-10-CM | POA: Diagnosis not present

## 2023-06-08 DIAGNOSIS — E049 Nontoxic goiter, unspecified: Secondary | ICD-10-CM | POA: Diagnosis not present

## 2023-06-08 DIAGNOSIS — E041 Nontoxic single thyroid nodule: Secondary | ICD-10-CM | POA: Diagnosis not present

## 2023-06-08 NOTE — Progress Notes (Signed)
GU Location of Tumor / Histology: Prostate Ca  Sites of Visceral and Bony Metastatic Disease: Left iliac bone  Location(s) of Symptomatic Metastases: Left iliac bone  PSA is (0.51 on 05/2023)    Radical Prostatectomy (2019)   05/30/2023 Dr. Berniece Salines MR Pelvis with/without Contrast CLINICAL DATA:  Remote history of prostate cancer with prostatectomy and biochemical recurrence. Abnormal uptake medially in the left iliac crest on recent Pylarify PET-CT. Evaluate for metastatic disease.  IMPRESSION: 1. Small enhancing lesion posteromedially in the left iliac crest, corresponding with the uptake on recent PET-CT. This is suspicious for metastatic disease. No other suspicious osseous lesions identified. 2. No evidence of pelvic lymphadenopathy or peritoneal nodularity. 3. Status post prostatectomy without evidence of local recurrence.  01/20/2023 Dr. Berniece Salines NM PET (PSMA) Skull to Mid Thigh CLINICAL DATA:  Prostate carcinoma with biochemical recurrence.  Prostatectomy 2021. PSA equal 0.5.  IMPRESSION: 1. Solitary focus of intense radiotracer activity within the posteromedial LEFT iliac bone. Differential include oligometastasis prostate carcinoma versus CT occult benign bone lesion. Skeletal metastasis unusual with PSA < 1. Consider follow-up surveillance or potentially MRI with without contrast to confirm presence of a lesion. 2. No evidence of local prostate cancer recurrence in the prostate bed. 3. No evidence of metastatic adenopathy in the pelvis or periaortic retroperitoneum. 4. No visceral metastasis or skeletal metastasis.   Past/Anticipated interventions by urology, if any: NA  Past/Anticipated interventions by medical oncology, if any: NA  Weight changes, if any:  Weight loss of 10 lbs.  IPSS:  6 SHIM:  5  If Spine Met(s), symptoms, if any, include: Bowel/Bladder retention or incontinence (please describe): urinary incontinence and denies bowel  issues. Numbness or weakness in extremities (please describe): yes , bilateral feet by diabetes Current Decadron regimen, if applicable: No Nausea/Vomiting, if any: No Pain issues, if any:  4/10 lower back and right knee.   SAFETY ISSUES: Prior radiation?  No Pacemaker/ICD? No Possible current pregnancy? Male Is the patient on methotrexate? No  Current Complaints / other details:

## 2023-06-09 DIAGNOSIS — Z0000 Encounter for general adult medical examination without abnormal findings: Secondary | ICD-10-CM | POA: Diagnosis not present

## 2023-06-09 DIAGNOSIS — R82998 Other abnormal findings in urine: Secondary | ICD-10-CM | POA: Diagnosis not present

## 2023-06-09 DIAGNOSIS — E1169 Type 2 diabetes mellitus with other specified complication: Secondary | ICD-10-CM | POA: Diagnosis not present

## 2023-06-09 DIAGNOSIS — I10 Essential (primary) hypertension: Secondary | ICD-10-CM | POA: Diagnosis not present

## 2023-06-09 DIAGNOSIS — C61 Malignant neoplasm of prostate: Secondary | ICD-10-CM | POA: Diagnosis not present

## 2023-06-12 ENCOUNTER — Encounter: Payer: Self-pay | Admitting: Radiation Oncology

## 2023-06-12 ENCOUNTER — Ambulatory Visit
Admission: RE | Admit: 2023-06-12 | Discharge: 2023-06-12 | Disposition: A | Discharge status: Home / Self Care | Payer: BC Managed Care – PPO | Source: Ambulatory Visit | Attending: Radiation Oncology | Admitting: Radiation Oncology

## 2023-06-12 VITALS — BP 153/79 | HR 73 | Temp 97.4°F | Resp 18 | Ht 69.0 in | Wt 220.4 lb

## 2023-06-12 DIAGNOSIS — Z79899 Other long term (current) drug therapy: Secondary | ICD-10-CM | POA: Diagnosis not present

## 2023-06-12 DIAGNOSIS — Z87891 Personal history of nicotine dependence: Secondary | ICD-10-CM | POA: Insufficient documentation

## 2023-06-12 DIAGNOSIS — Z801 Family history of malignant neoplasm of trachea, bronchus and lung: Secondary | ICD-10-CM | POA: Insufficient documentation

## 2023-06-12 DIAGNOSIS — E669 Obesity, unspecified: Secondary | ICD-10-CM | POA: Insufficient documentation

## 2023-06-12 DIAGNOSIS — E119 Type 2 diabetes mellitus without complications: Secondary | ICD-10-CM | POA: Diagnosis not present

## 2023-06-12 DIAGNOSIS — I10 Essential (primary) hypertension: Secondary | ICD-10-CM | POA: Diagnosis not present

## 2023-06-12 DIAGNOSIS — C61 Malignant neoplasm of prostate: Secondary | ICD-10-CM | POA: Insufficient documentation

## 2023-06-12 DIAGNOSIS — E785 Hyperlipidemia, unspecified: Secondary | ICD-10-CM | POA: Diagnosis not present

## 2023-06-12 DIAGNOSIS — M5136 Other intervertebral disc degeneration, lumbar region: Secondary | ICD-10-CM | POA: Insufficient documentation

## 2023-06-12 DIAGNOSIS — K219 Gastro-esophageal reflux disease without esophagitis: Secondary | ICD-10-CM | POA: Insufficient documentation

## 2023-06-12 DIAGNOSIS — F17210 Nicotine dependence, cigarettes, uncomplicated: Secondary | ICD-10-CM | POA: Diagnosis not present

## 2023-06-12 DIAGNOSIS — Z794 Long term (current) use of insulin: Secondary | ICD-10-CM | POA: Insufficient documentation

## 2023-06-12 DIAGNOSIS — M549 Dorsalgia, unspecified: Secondary | ICD-10-CM | POA: Diagnosis not present

## 2023-06-12 DIAGNOSIS — Z191 Hormone sensitive malignancy status: Secondary | ICD-10-CM | POA: Diagnosis not present

## 2023-06-12 NOTE — Progress Notes (Signed)
Radiation Oncology         (336) [phone] ________________________________  Initial Outpatient Consultation  Name: Shawn Hicks MRN: [id number]  Date: 06/12/2023  DOB: 07/07/1966  WU:[id number], Loraine Leriche, MD  Crist Fat, MD   REFERRING PHYSICIAN: Crist Fat, MD  DIAGNOSIS: 57 y.o. gentleman with a rising PSA of 0.5 s/p RALP 01/2018 for Stage pT3aN0M1b, Gleason 3+4 prostate cancer    ICD-10-CM   1. Malignant neoplasm of prostate (HCC)  C61       HISTORY OF PRESENT ILLNESS: Shawn Hicks is a 57 y.o. male with a diagnosis of biochemically recurrent prostate cancer. He was initially diagnosed with Gleason 3+4 prostate cancer on 08/18/17 with a PSA of 6.49. He opted to proceed with RALP on 01/19/18 under Dr. Marlou Porch. Pathology revealed Gleason 3+4 prostatic adenocarcinoma with focal extraprostatic extension and positive margin at left posterolateral lower mid and an additional positive margin at right posterior apex. Seminal vesicles, other margins, and all 6 lymph nodes were negative. His postoperative PSA was undetectable.  Unfortunately, his PSA became barely detectable at 0.017 in 02/2019. His PSA has continued to rise since that time and has most recently risen to 0.5 in 12/2022. This prompted a staging PSMA PET scan on 01/20/23 showing: solitary focus of intense radiotracer activity within posteromedial left iliac bone; no evidence of local recurrence, metastatic adenopathy, or visceral metastasis. The lesion was further evaluated with a pelvis MRI on 05/30/23 showing: small enhancing lesion posteromedially in left iliac crest, corresponding to uptake on recent PET, suspicious for metastatic disease; no evidence of pelvic lymphadenopathy, peritoneal nodularity, or local recurrence.  The patient reviewed the biopsy results with his urologist and he has kindly been referred today for discussion of potential radiation treatment options.   PREVIOUS RADIATION THERAPY: No  PAST MEDICAL  HISTORY:  Past Medical History:  Diagnosis Date   Back pain    Bilateral medial epicondylitis of elbow joint    Boil    under arm right   Chest pain    Depression    Diabetes mellitus without complication (HCC)    ED (erectile dysfunction)    Elevated PSA    GERD (gastroesophageal reflux disease)    Gout    Hyperlipidemia    Hypertension    Migraine    Occular   Obesity    OSA on CPAP    Palpitations    Pneumonia    Hospitalized childhood   Prostate cancer (HCC)    Tobacco abuse       PAST SURGICAL HISTORY: Past Surgical History:  Procedure Laterality Date   DENTAL SURGERY     Wisdom teeth   LYMPHADENECTOMY N/A 01/19/2018   Procedure: BILATERAL LYMPHADENECTOMY;  Surgeon: Crist Fat, MD;  Location: [facility];  Service: Urology;  Laterality: N/A;   PROSTATE BIOPSY     ROBOT ASSISTED LAPAROSCOPIC RADICAL PROSTATECTOMY N/A 01/19/2018   Procedure: XI ROBOTIC ASSISTED LAPAROSCOPIC RADICAL PROSTATECTOMY;  Surgeon: Crist Fat, MD;  Location: [facility];  Service: Urology;  Laterality: N/A;    FAMILY HISTORY:  Family History  Problem Relation Age of Onset   Liver disease Mother    Cancer Father        prostate cancer   Heart attack Father    Sleep apnea Brother    Cancer Maternal Grandfather        lung cancer/smoker    SOCIAL HISTORY:  Social History   Socioeconomic History   Marital status: Married  Spouse name: Vicky   Number of children: 0   Years of education: college   Highest education level: Not on file  Occupational History    Comment: works second shift  Tobacco Use   Smoking status: Former    Types: Cigars    Quit date: 11/18/2017    Years since quitting: 5.5   Smokeless tobacco: Never  Vaping Use   Vaping status: Every Day   Start date: 11/18/2017  Substance and Sexual Activity   Alcohol use: Yes    Alcohol/week: 3.0 standard drinks of alcohol    Types: 3 Shots of liquor per week    Comment: 2-3 times a month.    Drug use: Not  Currently   Sexual activity: Yes  Other Topics Concern   Not on file  Social History Narrative   Patient lives at home with his wife Chip Boer) and his father.   Patient works full time Works hours 3:00 pm until 2:oo am every night.   Education college.    Right handed.   Caffeine Two cups of coffee daily and one mountain dew daily.   Social Determinants of Health   Financial Resource Strain: Not on file  Food Insecurity: No Food Insecurity (06/12/2023)   Hunger Vital Sign    Worried About Running Out of Food in the Last Year: Never true    Ran Out of Food in the Last Year: Never true  Transportation Needs: No Transportation Needs (06/12/2023)   PRAPARE - Administrator, Civil Service (Medical): No    Lack of Transportation (Non-Medical): No  Physical Activity: Not on file  Stress: Not on file  Social Connections: Unknown (01/28/2022)   [facility]   Social Network    Social Network: Not on file  Intimate Partner Violence: Not At Risk (06/12/2023)   Humiliation, Afraid, Rape, and Kick questionnaire    Fear of Current or Ex-Partner: No    Emotionally Abused: No    Physically Abused: No    Sexually Abused: No    ALLERGIES: Allopurinol, Cephalosporins, Clindamycin, and Sulfamethoxazole-trimethoprim  MEDICATIONS:  Current Outpatient Medications  Medication Sig Dispense Refill   ezetimibe (ZETIA) 10 MG tablet 1 tablet Orally Once a day for 90 days     FLUoxetine (PROZAC) 40 MG capsule Take 40 mg by mouth daily.     glucose blood (ONETOUCH VERIO) test strip use as directed; check ac breakfast, supper daily  dx code: e11.69 In Vitro     lisinopril (ZESTRIL) 10 MG tablet Take 10 mg by mouth daily.     TOUJEO SOLOSTAR 300 UNIT/ML Solostar Pen 45u Subcutaneous once a day     atorvastatin (LIPITOR) 40 MG tablet Take 40 mg by mouth every evening.      Dapagliflozin-Metformin HCl ER (XIGDUO XR) 06-999 MG TB24 Take 1 tablet by mouth every evening.       diclofenac sodium (VOLTAREN) 1 % GEL Apply 1 application topically 2 (two) times daily as needed (pain).     Insulin Pen Needle (BD PEN NEEDLE NANO 2ND GEN) 32G X 4 MM MISC USE 1 TO INJECT FOUR TIMES DAILY for 90 days     Misc Natural Products (BLACK CHERRY CONCENTRATE PO) Take 1 tablet by mouth every evening.     neomycin-bacitracin-polymyxin (NEOSPORIN) ointment Apply 1 application topically as needed for wound care.     nitroGLYCERIN (NITROSTAT) 0.4 MG SL tablet Place 1 tablet (0.4 mg total) under the tongue every 5 (five)  minutes as needed for chest pain. 25 tablet 3   omeprazole (PRILOSEC) 20 MG capsule Take 20 mg by mouth every evening.      Vibegron (GEMTESA) 75 MG TABS Use as directed 75 mg in the mouth or throat every other day.     No current facility-administered medications for this encounter.    REVIEW OF SYSTEMS:  On review of systems, the patient reports that he is doing well overall. He denies any chest pain, shortness of breath, cough, fevers, chills, night sweats, unintended weight changes. He denies any bowel disturbances, and denies abdominal pain, nausea or vomiting. He denies any new musculoskeletal or joint aches or pains. His IPSS was 6, indicating mild urinary symptoms. His SHIM was 5, indicating he likely has postoperative erectile dysfunction. A complete review of systems is obtained and is otherwise negative.    PHYSICAL EXAM:  Wt Readings from Last 3 Encounters:  06/12/23 220 lb 6 oz (100 kg)  01/19/18 216 lb (98 kg)  01/18/18 216 lb 2 oz (98 kg)   Temp Readings from Last 3 Encounters:  06/12/23 (!) 97.4 F (36.3 C) (Temporal)  01/20/18 98.1 F (36.7 C) (Oral)  01/18/18 98 F (36.7 C) (Oral)   BP Readings from Last 3 Encounters:  06/12/23 (!) 153/79  04/25/23 (!) 140/74  01/20/18 117/69   Pulse Readings from Last 3 Encounters:  06/12/23 73  04/25/23 80  01/20/18 (!) 55   Pain Assessment Pain Score: 4  Pain Loc: Back (right knee and lower  back)/10  In general this is a well appearing man in no acute distress. He's alert and oriented x4 and appropriate throughout the examination. Cardiopulmonary assessment is negative for acute distress, and he exhibits normal effort.     KPS = 100  100 - Normal; no complaints; no evidence of disease. 90   - Able to carry on normal activity; minor signs or symptoms of disease. 80   - Normal activity with effort; some signs or symptoms of disease. 49   - Cares for self; unable to carry on normal activity or to do active work. 60   - Requires occasional assistance, but is able to care for most of his personal needs. 50   - Requires considerable assistance and frequent medical care. 40   - Disabled; requires special care and assistance. 30   - Severely disabled; hospital admission is indicated although death not imminent. 20   - Very sick; hospital admission necessary; active supportive treatment necessary. 10   - Moribund; fatal processes progressing rapidly. 0     - Dead  Karnofsky DA, Abelmann WH, Craver LS and Burchenal [facility] [phone]) The use of the nitrogen mustards in the palliative treatment of carcinoma: with particular reference to bronchogenic carcinoma Cancer 1 634-56  LABORATORY DATA:  Lab Results  Component Value Date   WBC 12.0 (H) 01/20/2018   HGB 12.3 (L) 01/20/2018   HCT 37.3 (L) 01/20/2018   MCV 92.1 01/20/2018   PLT 210 01/20/2018   Lab Results  Component Value Date   NA 133 (L) 01/20/2018   K 4.6 01/20/2018   CL 103 01/20/2018   CO2 22 01/20/2018   Lab Results  Component Value Date   ALT 28 01/18/2018   AST 23 01/18/2018   ALKPHOS 61 01/18/2018   BILITOT 0.8 01/18/2018     RADIOGRAPHY: MR PELVIS W WO CONTRAST  Result Date: 06/01/2023 CLINICAL DATA:  Remote history of prostate cancer with prostatectomy and biochemical recurrence. Abnormal  uptake medially in the left iliac crest on recent Pylarify PET-CT. Evaluate for metastatic disease. EXAM: MRI PELVIS WITHOUT  AND WITH CONTRAST TECHNIQUE: Multiplanar multisequence MR imaging of the pelvis was performed both before and after administration of intravenous contrast. CONTRAST:  10 cc Vueway COMPARISON:  PET-CT 01/20/2023. No other relevant comparison studies. FINDINGS: Urinary Tract: The visualized distal ureters and bladder appear unremarkable. Bowel: No bowel wall thickening, distention or surrounding inflammation identified within the pelvis. Vascular/Lymphatic: No enlarged pelvic lymph nodes identified. No significant vascular findings. Reproductive: Status post prostatectomy. No recurrent mass lesion identified. Other: No pelvic ascites or peritoneal nodularity. Musculoskeletal: Corresponding with the uptake on the recent PET-CT is a small lesion posteromedially in the left iliac crest, only included on the coronal and sagittal images. (Axial images do not extend far enough superiorly to include this area.) The lesion measures approximately 1.3 x 0.9 cm on image 29/4 and demonstrates T2 hyperintensity and enhancement following contrast. No other suspicious osseous lesions are identified. There is a small incidental synovial herniation pit anteriorly in the left femoral neck. Degenerative disc disease noted at L5-S. There is facet disease in the lower lumbar spine. IMPRESSION: 1. Small enhancing lesion posteromedially in the left iliac crest, corresponding with the uptake on recent PET-CT. This is suspicious for metastatic disease. No other suspicious osseous lesions identified. 2. No evidence of pelvic lymphadenopathy or peritoneal nodularity. 3. Status post prostatectomy without evidence of local recurrence. Electronically Signed   By: Carey Bullocks M.D.   On: 06/01/2023 16:45   [place] FNA BX THYROID 1ST LESION AFIRMA  Result Date: 05/16/2023 INDICATION: Indeterminate thyroid nodule EXAM: ULTRASOUND GUIDED FINE NEEDLE ASPIRATION OF INDETERMINATE THYROID NODULE COMPARISON:  Prior thyroid ultrasound 04/18/2023  MEDICATIONS: None COMPLICATIONS: None immediate. TECHNIQUE: Informed written consent was obtained from the patient after a discussion of the risks, benefits and alternatives to treatment. Questions regarding the procedure were encouraged and answered. A timeout was performed prior to the initiation of the procedure. Pre-procedural ultrasound scanning demonstrated unchanged size and appearance of the indeterminate nodule within the left gland The procedure was planned. The neck was prepped in the usual sterile fashion, and a sterile drape was applied covering the operative field. A timeout was performed prior to the initiation of the procedure. Local anesthesia was provided with 1% lidocaine. Under direct ultrasound guidance, 5 FNA biopsies were performed of the mass with a 25 gauge needle. Two samples were reserved for future Afirma testing. Multiple ultrasound images were saved for procedural documentation purposes. The samples were prepared and submitted to pathology. Limited post procedural scanning was negative for hematoma or additional complication. Dressings were placed. The patient tolerated the above procedures procedure well without immediate postprocedural complication. FINDINGS: Nodule reference number based on prior diagnostic ultrasound: 3 Maximum size: 5.5 cm Location: Left; Inferior ACR TI-RADS risk category: TR3 (3 points) Reason for biopsy: meets ACR TI-RADS criteria Ultrasound imaging confirms appropriate placement of the needles within the thyroid nodule. IMPRESSION: Technically successful ultrasound guided fine needle aspiration of left inferior thyroid nodule. Electronically Signed   By: Malachy Moan M.D.   On: 05/16/2023 16:34      IMPRESSION/PLAN: 1. 57 y.o. gentleman with a rising PSA of 0.5 s/p RALP 01/2018 for Stage pT3aN0M1b, Gleason 3+4 prostate cancer  Today I reviewed the findings and workup thus far.  We discussed the natural history of prostate cancer.  We reviewed the the  implications of positive margins, extracapsular extension, and seminal vesicle involvement on the risk of  prostate cancer recurrence. In his case, extraprostatic extension and two positive margins were present. Most recent imaging also indicates metastatic disease to the left iliac crest. Fortunately, this lesion remains asymptomatic. We reviewed some of the evidence suggesting an advantage for patients who undergo adjuvant radiotherapy in the setting in terms of disease control and overall survival. We discussed radiation treatment directed to the prostatic fossa, surrounding lymph nodes, and left iliac crest with regard to the logistics and delivery of external beam radiation treatment.  At the conclusion of our conversation, the patient is interested in moving forward with 7.5 weeks of salvage external beam therapy targeted at the prostate bed, surrounding lymph nodes, and left iliac lesion. We discussed the use of ADT in combination with radiotherapy. We discussed some of the pros and cons of ADT and he had questions about QOL. The patient has a lower testosterone level, and given his oligometastatic disease, we have decided to forego adjuvant ADT therapy. We will share our discussion with Dr. Marlou Porch. The patient appears to have a good understanding of his disease and our treatment recommendations which are of curative intent and is in agreement with the stated plan.  Therefore, we will move forward with treatment planning accordingly, in anticipation of beginning IMRT in the near future. We reviewed a consent today and it was freely signed. The patient is scheduled for CT simulation on October 1st.   Notably, the patient does need a partial thymectomy that has not yet been scheduled. He would like to finish his radiation treatment and surgery prior to the new year. We will move forward with planning the radiation treatment, and if the surgery is scheduled during that time, we can pause radiation for a few  days. Patient expressed understanding and would like to proceed with this plan.    We spent 60 minutes face to face with the patient and more than 50% of that time was spent in counseling and/or coordination of care.     Joyice Faster, PA-C    Margaretmary Dys, MD  [facility]  Radiation Oncology Direct Dial: [phone]  Fax: [phone] [email or web address]  Skype  [email or web address]   This document serves as a record of services personally performed by Margaretmary Dys, MD and Joyice Faster, PA-C. It was created on their behalf by Mickie Bail, a trained medical scribe. The creation of this record is based on the scribe's personal observations and the provider's statements to them. This document has been checked and approved by the attending provider.

## 2023-06-12 NOTE — Progress Notes (Signed)
Introduced myself to the patient as the prostate nurse navigator.  No barriers to care identified at this time.  He is here to discuss his radiation treatment options.  I gave him my business card and asked him to call me with questions or concerns.  Verbalized understanding.  ?

## 2023-06-13 ENCOUNTER — Other Ambulatory Visit: Payer: Self-pay | Admitting: *Deleted

## 2023-06-13 DIAGNOSIS — E049 Nontoxic goiter, unspecified: Secondary | ICD-10-CM

## 2023-06-20 ENCOUNTER — Ambulatory Visit
Admission: RE | Admit: 2023-06-20 | Discharge: 2023-06-20 | Disposition: A | Discharge status: Home / Self Care | Payer: BC Managed Care – PPO | Source: Ambulatory Visit | Attending: Radiation Oncology | Admitting: Radiation Oncology

## 2023-06-20 DIAGNOSIS — Z510 Encounter for antineoplastic radiation therapy: Secondary | ICD-10-CM | POA: Insufficient documentation

## 2023-06-20 DIAGNOSIS — C61 Malignant neoplasm of prostate: Secondary | ICD-10-CM

## 2023-06-20 DIAGNOSIS — Z191 Hormone sensitive malignancy status: Secondary | ICD-10-CM | POA: Diagnosis not present

## 2023-06-20 NOTE — Progress Notes (Signed)
Radiation Oncology         [phone] ________________________________  Name: Shawn Hicks MRN: [id number]  Date: 06/20/2023  DOB: [birth year]  SIMULATION AND TREATMENT PLANNING NOTE    ICD-10-CM   1. Malignant neoplasm of prostate metastatic to bone [facility])  C61    C79.51       DIAGNOSIS:  57 y.o. 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  Identity was confirmed.  All relevant records and images related to the planned course of therapy were reviewed.  The patient freely provided informed written consent to proceed with treatment after reviewing the details related to the planned course of therapy. The consent form was witnessed and verified by the simulation staff.  Then, the patient was set-up in a stable reproducible supine position for radiation therapy.  A vacuum lock pillow device was custom fabricated to position his legs in a reproducible immobilized position.  Then, I performed a urethrogram under sterile conditions to identify the prostatic apex.  CT images were obtained.  Surface markings were placed.  The CT images were loaded into the planning software.  Then the prostate target and avoidance structures including the rectum, bladder, bowel and hips were contoured.  Treatment planning then occurred.  The radiation prescription was entered and confirmed.  A total of one complex treatment devices was fabricated. I have requested : Intensity Modulated Radiotherapy (IMRT) is medically necessary for this case for the following reason:  Rectal sparing.Marland Kitchen  PLAN:   The prostate fossa and pelvic lymph nodes will initially be treated to 45 Gy in 25 fractions of 1.8 Gy followed by a boost to the fossa and solitary left iliac bone metastasis, to 68.4 Gy with 13 additional fractions of 1.8 Gy    ________________________________  Artist Pais Kathrynn Running, M.D.

## 2023-06-21 ENCOUNTER — Other Ambulatory Visit: Payer: BC Managed Care – PPO

## 2023-06-21 DIAGNOSIS — N393 Stress incontinence (female) (male): Secondary | ICD-10-CM | POA: Diagnosis not present

## 2023-06-27 DIAGNOSIS — Z510 Encounter for antineoplastic radiation therapy: Secondary | ICD-10-CM | POA: Diagnosis not present

## 2023-06-27 DIAGNOSIS — C61 Malignant neoplasm of prostate: Secondary | ICD-10-CM | POA: Diagnosis not present

## 2023-06-27 DIAGNOSIS — Z191 Hormone sensitive malignancy status: Secondary | ICD-10-CM | POA: Diagnosis not present

## 2023-07-03 DIAGNOSIS — M9904 Segmental and somatic dysfunction of sacral region: Secondary | ICD-10-CM | POA: Diagnosis not present

## 2023-07-03 DIAGNOSIS — M9903 Segmental and somatic dysfunction of lumbar region: Secondary | ICD-10-CM | POA: Diagnosis not present

## 2023-07-03 DIAGNOSIS — M9905 Segmental and somatic dysfunction of pelvic region: Secondary | ICD-10-CM | POA: Diagnosis not present

## 2023-07-03 DIAGNOSIS — M545 Low back pain, unspecified: Secondary | ICD-10-CM | POA: Diagnosis not present

## 2023-07-03 NOTE — Progress Notes (Signed)
RN left message for call back to assess any needs prior to starting treatment.

## 2023-07-04 ENCOUNTER — Ambulatory Visit: Payer: BC Managed Care – PPO | Admitting: Radiation Oncology

## 2023-07-04 ENCOUNTER — Ambulatory Visit: Payer: BC Managed Care – PPO

## 2023-07-06 ENCOUNTER — Other Ambulatory Visit: Payer: Self-pay

## 2023-07-06 ENCOUNTER — Ambulatory Visit
Admission: RE | Admit: 2023-07-06 | Discharge: 2023-07-06 | Disposition: A | Discharge status: Home / Self Care | Payer: BC Managed Care – PPO | Source: Ambulatory Visit | Attending: Radiation Oncology | Admitting: Radiation Oncology

## 2023-07-06 ENCOUNTER — Ambulatory Visit: Payer: BC Managed Care – PPO

## 2023-07-06 DIAGNOSIS — C61 Malignant neoplasm of prostate: Secondary | ICD-10-CM | POA: Diagnosis not present

## 2023-07-06 DIAGNOSIS — Z510 Encounter for antineoplastic radiation therapy: Secondary | ICD-10-CM | POA: Diagnosis not present

## 2023-07-06 DIAGNOSIS — Z191 Hormone sensitive malignancy status: Secondary | ICD-10-CM | POA: Diagnosis not present

## 2023-07-06 LAB — RAD ONC ARIA SESSION SUMMARY
Course Elapsed Days: 0
Plan Fractions Treated to Date: 1
Plan Prescribed Dose Per Fraction: 1.8 Gy
Plan Total Fractions Prescribed: 25
Plan Total Prescribed Dose: 45 Gy
Reference Point Dosage Given to Date: 1.8 Gy
Reference Point Session Dosage Given: 1.8 Gy
Session Number: 1

## 2023-07-07 ENCOUNTER — Ambulatory Visit
Admission: RE | Admit: 2023-07-07 | Discharge: 2023-07-07 | Disposition: A | Discharge status: Home / Self Care | Payer: BC Managed Care – PPO | Source: Ambulatory Visit | Attending: Radiation Oncology

## 2023-07-07 ENCOUNTER — Ambulatory Visit
Admission: RE | Admit: 2023-07-07 | Discharge: 2023-07-07 | Disposition: A | Discharge status: Home / Self Care | Payer: BC Managed Care – PPO | Source: Ambulatory Visit | Attending: Radiation Oncology | Admitting: Radiation Oncology

## 2023-07-07 ENCOUNTER — Other Ambulatory Visit: Payer: Self-pay

## 2023-07-07 DIAGNOSIS — Z191 Hormone sensitive malignancy status: Secondary | ICD-10-CM | POA: Diagnosis not present

## 2023-07-07 DIAGNOSIS — C61 Malignant neoplasm of prostate: Secondary | ICD-10-CM | POA: Diagnosis not present

## 2023-07-07 DIAGNOSIS — Z510 Encounter for antineoplastic radiation therapy: Secondary | ICD-10-CM | POA: Diagnosis not present

## 2023-07-07 LAB — RAD ONC ARIA SESSION SUMMARY
Course Elapsed Days: 1
Plan Fractions Treated to Date: 2
Plan Prescribed Dose Per Fraction: 1.8 Gy
Plan Total Fractions Prescribed: 25
Plan Total Prescribed Dose: 45 Gy
Reference Point Dosage Given to Date: 3.6 Gy
Reference Point Session Dosage Given: 1.8 Gy
Session Number: 2

## 2023-07-10 ENCOUNTER — Other Ambulatory Visit: Payer: Self-pay

## 2023-07-10 ENCOUNTER — Ambulatory Visit
Admission: RE | Admit: 2023-07-10 | Discharge: 2023-07-10 | Disposition: A | Discharge status: Home / Self Care | Payer: BC Managed Care – PPO | Source: Ambulatory Visit | Attending: Radiation Oncology

## 2023-07-10 DIAGNOSIS — C61 Malignant neoplasm of prostate: Secondary | ICD-10-CM | POA: Diagnosis not present

## 2023-07-10 DIAGNOSIS — Z510 Encounter for antineoplastic radiation therapy: Secondary | ICD-10-CM | POA: Diagnosis not present

## 2023-07-10 DIAGNOSIS — Z191 Hormone sensitive malignancy status: Secondary | ICD-10-CM | POA: Diagnosis not present

## 2023-07-10 LAB — RAD ONC ARIA SESSION SUMMARY
Course Elapsed Days: 4
Plan Fractions Treated to Date: 3
Plan Prescribed Dose Per Fraction: 1.8 Gy
Plan Total Fractions Prescribed: 25
Plan Total Prescribed Dose: 45 Gy
Reference Point Dosage Given to Date: 5.4 Gy
Reference Point Session Dosage Given: 1.8 Gy
Session Number: 3

## 2023-07-10 NOTE — Pre-Procedure Instructions (Signed)
Surgical Instructions   Your procedure is scheduled on July 17, 2023. Report to [facility] Main Entrance "A" at 7:30 A.M., then check in with the Admitting office. Any questions or running late day of surgery: call [phone]  Questions prior to your surgery date: call [phone], Monday-Friday, 8am-4pm. If you experience any cold or flu symptoms such as cough, fever, chills, shortness of breath, etc. between now and your scheduled surgery, please notify us at the above number.     Remember:  Do not eat after midnight the night before your surgery   You may drink clear liquids until 6:30 AM the morning of your surgery.   Clear liquids allowed are: Water, Non-Citrus Juices (without pulp), Carbonated Beverages, Clear Tea, Black Coffee Only (NO MILK, CREAM OR POWDERED CREAMER of any kind), and Gatorade.    Take these medicines the morning of surgery with A SIP OF WATER: ezetimibe (ZETIA)  FLUoxetine (PROZAC)  Vibegron (GEMTESA)    May take these medicines IF NEEDED: nitroGLYCERIN (NITROSTAT) - please call [phone] if dose taken prior to surgery   One week prior to surgery, STOP taking any Aspirin (unless otherwise instructed by your surgeon) Aleve, Naproxen, Ibuprofen, Motrin, Advil, Goody's, BC's, all herbal medications, fish oil, and non-prescription vitamins. This includes your medication: diclofenac sodium (VOLTAREN) GEL    WHAT DO I DO ABOUT MY DIABETES MEDICATION?   STOP taking your Dapagliflozin-Metformin HCl ER (XIGDUO XR) three days prior to surgery. Your last dose will be October 24th.    THE EVENING BEFORE SURGERY/MORNING OF SURGERY, take 22 units of TOUJEO SOLOSTAR insulin.    HOW TO MANAGE YOUR DIABETES BEFORE AND AFTER SURGERY  Why is it important to control my blood sugar before and after surgery? Improving blood sugar levels before and after surgery helps healing and can limit problems. A way of improving blood sugar control is eating a healthy  diet by:  Eating less sugar and carbohydrates  Increasing activity/exercise  Talking with your doctor about reaching your blood sugar goals High blood sugars (greater than 180 mg/dL) can raise your risk of infections and slow your recovery, so you will need to focus on controlling your diabetes during the weeks before surgery. Make sure that the doctor who takes care of your diabetes knows about your planned surgery including the date and location.  How do I manage my blood sugar before surgery? Check your blood sugar at least 4 times a day, starting 2 days before surgery, to make sure that the level is not too high or low.  Check your blood sugar the morning of your surgery when you wake up and every 2 hours until you get to the Short Stay unit.  If your blood sugar is less than 70 mg/dL, you will need to treat for low blood sugar: Do not take insulin. Treat a low blood sugar (less than 70 mg/dL) with  cup of clear juice (cranberry or apple), 4 glucose tablets, OR glucose gel. Recheck blood sugar in 15 minutes after treatment (to make sure it is greater than 70 mg/dL). If your blood sugar is not greater than 70 mg/dL on recheck, call [phone] for further instructions. Report your blood sugar to the short stay nurse when you get to Short Stay.  If you are admitted to the hospital after surgery: Your blood sugar will be checked by the staff and you will probably be given insulin after surgery (instead of oral diabetes medicines) to make sure you have good blood sugar  levels. The goal for blood sugar control after surgery is 80-180 mg/dL.                      Do NOT Smoke (Tobacco/Vaping) for 24 hours prior to your procedure.  If you use a CPAP at night, you may bring your mask/headgear for your overnight stay.   You will be asked to remove any contacts, glasses, piercing's, hearing aid's, dentures/partials prior to surgery. Please bring cases for these items if needed.    Patients  discharged the day of surgery will not be allowed to drive home, and someone needs to stay with them for 24 hours.  SURGICAL WAITING ROOM VISITATION Patients may have no more than 2 support people in the waiting area - these visitors may rotate.   Pre-op nurse will coordinate an appropriate time for 1 ADULT support person, who may not rotate, to accompany patient in pre-op.  Children under the age of 80 must have an adult with them who is not the patient and must remain in the main waiting area with an adult.  If the patient needs to stay at the hospital during part of their recovery, the visitor guidelines for inpatient rooms apply.  Please refer to the [facility] website for the visitor guidelines for any additional information.   If you received a COVID test during your pre-op visit  it is requested that you wear a mask when out in public, stay away from anyone that may not be feeling well and notify your surgeon if you develop symptoms. If you have been in contact with anyone that has tested positive in the last 10 days please notify you surgeon.      Pre-operative CHG Bathing Instructions   You can play a key role in reducing the risk of infection after surgery. Your skin needs to be as free of germs as possible. You can reduce the number of germs on your skin by washing with CHG (chlorhexidine gluconate) soap before surgery. CHG is an antiseptic soap that kills germs and continues to kill germs even after washing.   DO NOT use if you have an allergy to chlorhexidine/CHG or antibacterial soaps. If your skin becomes reddened or irritated, stop using the CHG and notify one of our RNs at [phone].              TAKE A SHOWER THE NIGHT BEFORE SURGERY AND THE DAY OF SURGERY    Please keep in mind the following:  DO NOT shave, including legs and underarms, 48 hours prior to surgery.   You may shave your face before/day of surgery.  Place clean sheets on your bed the night before  surgery Use a clean washcloth (not used since being washed) for each shower. DO NOT sleep with pet's night before surgery.  CHG Shower Instructions:  Wash your face and private area with normal soap. If you choose to wash your hair, wash first with your normal shampoo.  After you use shampoo/soap, rinse your hair and body thoroughly to remove shampoo/soap residue.  Turn the water OFF and apply half the bottle of CHG soap to a CLEAN washcloth.  Apply CHG soap ONLY FROM YOUR NECK DOWN TO YOUR TOES (washing for 3-5 minutes)  DO NOT use CHG soap on face, private areas, open wounds, or sores.  Pay special attention to the area where your surgery is being performed.  If you are having back surgery, having someone wash your back for  you may be helpful. Wait 2 minutes after CHG soap is applied, then you may rinse off the CHG soap.  Pat dry with a clean towel  Put on clean pajamas    Additional instructions for the day of surgery: DO NOT APPLY any lotions, deodorants, cologne, or perfumes.   Do not wear jewelry or makeup Do not wear nail polish, gel polish, artificial nails, or any other type of covering on natural nails (fingers and toes) Do not bring valuables to the hospital. [facility] is not responsible for valuables/personal belongings. Put on clean/comfortable clothes.  Please brush your teeth.  Ask your nurse before applying any prescription medications to the skin.

## 2023-07-11 ENCOUNTER — Other Ambulatory Visit: Payer: Self-pay

## 2023-07-11 ENCOUNTER — Encounter (HOSPITAL_COMMUNITY)
Admission: RE | Admit: 2023-07-11 | Discharge: 2023-07-11 | Disposition: A | Discharge status: Home / Self Care | Payer: BC Managed Care – PPO | Source: Ambulatory Visit | Attending: Surgery | Admitting: Surgery

## 2023-07-11 ENCOUNTER — Encounter (HOSPITAL_COMMUNITY): Payer: Self-pay

## 2023-07-11 ENCOUNTER — Ambulatory Visit
Admission: RE | Admit: 2023-07-11 | Discharge: 2023-07-11 | Disposition: A | Discharge status: Home / Self Care | Payer: BC Managed Care – PPO | Source: Ambulatory Visit | Attending: Radiation Oncology | Admitting: Radiation Oncology

## 2023-07-11 VITALS — BP 108/67 | HR 73 | Temp 98.3°F | Resp 19 | Ht 69.0 in | Wt 219.8 lb

## 2023-07-11 DIAGNOSIS — E119 Type 2 diabetes mellitus without complications: Secondary | ICD-10-CM | POA: Diagnosis not present

## 2023-07-11 DIAGNOSIS — I10 Essential (primary) hypertension: Secondary | ICD-10-CM | POA: Diagnosis not present

## 2023-07-11 DIAGNOSIS — Z191 Hormone sensitive malignancy status: Secondary | ICD-10-CM | POA: Diagnosis not present

## 2023-07-11 DIAGNOSIS — Z510 Encounter for antineoplastic radiation therapy: Secondary | ICD-10-CM | POA: Diagnosis not present

## 2023-07-11 DIAGNOSIS — Z01818 Encounter for other preprocedural examination: Secondary | ICD-10-CM | POA: Insufficient documentation

## 2023-07-11 DIAGNOSIS — C61 Malignant neoplasm of prostate: Secondary | ICD-10-CM | POA: Diagnosis not present

## 2023-07-11 LAB — RAD ONC ARIA SESSION SUMMARY
Course Elapsed Days: 5
Plan Fractions Treated to Date: 4
Plan Prescribed Dose Per Fraction: 1.8 Gy
Plan Total Fractions Prescribed: 25
Plan Total Prescribed Dose: 45 Gy
Reference Point Dosage Given to Date: 7.2 Gy
Reference Point Session Dosage Given: 1.8 Gy
Session Number: 4

## 2023-07-11 LAB — CBC
HCT: 46.3 % (ref 39.0–52.0)
Hemoglobin: 15.7 g/dL (ref 13.0–17.0)
MCH: 32.4 pg (ref 26.0–34.0)
MCHC: 33.9 g/dL (ref 30.0–36.0)
MCV: 95.7 fL (ref 80.0–100.0)
Platelets: 183 10*3/uL (ref 150–400)
RBC: 4.84 MIL/uL (ref 4.22–5.81)
RDW: 12.8 % (ref 11.5–15.5)
WBC: 4.5 10*3/uL (ref 4.0–10.5)
nRBC: 0 % (ref 0.0–0.2)

## 2023-07-11 LAB — BASIC METABOLIC PANEL
Anion gap: 10 (ref 5–15)
BUN: 15 mg/dL (ref 6–20)
CO2: 21 mmol/L — ABNORMAL LOW (ref 22–32)
Calcium: 9.6 mg/dL (ref 8.9–10.3)
Chloride: 104 mmol/L (ref 98–111)
Creatinine, Ser: 0.8 mg/dL (ref 0.61–1.24)
GFR, Estimated: >60 mL/min (ref >60)
Glucose, Bld: 192 mg/dL — ABNORMAL HIGH (ref 70–99)
Potassium: 4.4 mmol/L (ref 3.5–5.1)
Sodium: 135 mmol/L (ref 135–145)

## 2023-07-11 LAB — GLUCOSE, CAPILLARY: Glucose-Capillary: 225 mg/dL — ABNORMAL HIGH (ref 70–99)

## 2023-07-11 LAB — TYPE AND SCREEN
ABO/RH(D): A POS
Antibody Screen: NEGATIVE

## 2023-07-11 LAB — SURGICAL PCR SCREEN
MRSA, PCR: NEGATIVE
Staphylococcus aureus: NEGATIVE

## 2023-07-11 NOTE — Progress Notes (Addendum)
PCP - Rodrigo Ran, MD  Cardiologist - denies  PPM/ICD - denies Device Orders - n/a Rep Notified - n/a  Chest x-ray -  EKG - 07-11-23 Stress Test - 01-16-18 ECHO - 01-06-17 Cardiac Cath -  CT Coronary- 09-26-22  Sleep Study -  5 + years ago CPAP - nightly  Fasting Blood Sugar - between 150-180 per patient. Checks Blood Sugar 2-3 times a day A1c 7.8 06-07-23  Blood Thinner Instructions: denies Aspirin Instructions: n/a  ERAS Protcol - clear liquids until 6:30 am PRE-SURGERY Ensure or G2-   COVID TEST- n/a   Anesthesia review: yes HTN, DM, OSA,  Patient denies shortness of breath, fever, cough and chest pain at PAT appointment   All instructions explained to the patient, with a verbal understanding of the material. Patient agrees to go over the instructions while at home for a better understanding. Patient also instructed to self quarantine after being tested for COVID-19. The opportunity to ask questions was provided.

## 2023-07-12 ENCOUNTER — Other Ambulatory Visit: Payer: Self-pay

## 2023-07-12 ENCOUNTER — Ambulatory Visit
Admission: RE | Admit: 2023-07-12 | Discharge: 2023-07-12 | Disposition: A | Discharge status: Home / Self Care | Payer: BC Managed Care – PPO | Source: Ambulatory Visit | Attending: Radiation Oncology | Admitting: Radiation Oncology

## 2023-07-12 DIAGNOSIS — Z191 Hormone sensitive malignancy status: Secondary | ICD-10-CM | POA: Diagnosis not present

## 2023-07-12 DIAGNOSIS — Z510 Encounter for antineoplastic radiation therapy: Secondary | ICD-10-CM | POA: Diagnosis not present

## 2023-07-12 DIAGNOSIS — C61 Malignant neoplasm of prostate: Secondary | ICD-10-CM | POA: Diagnosis not present

## 2023-07-12 LAB — RAD ONC ARIA SESSION SUMMARY
Course Elapsed Days: 6
Plan Fractions Treated to Date: 5
Plan Prescribed Dose Per Fraction: 1.8 Gy
Plan Total Fractions Prescribed: 25
Plan Total Prescribed Dose: 45 Gy
Reference Point Dosage Given to Date: 9 Gy
Reference Point Session Dosage Given: 1.8 Gy
Session Number: 5

## 2023-07-12 NOTE — Anesthesia Preprocedure Evaluation (Addendum)
Anesthesia Evaluation  Patient identified by MRN, date of birth, ID band Patient awake    Reviewed: Allergy & Precautions, NPO status , Patient's Chart, lab work & pertinent test results  Airway Mallampati: II  TM Distance: >3 FB Neck ROM: Full    Dental   Pulmonary sleep apnea , COPD, Patient abstained from smoking., former smoker   breath sounds clear to auscultation       Cardiovascular hypertension,  Rhythm:Regular Rate:Normal     Neuro/Psych negative neurological ROS     GI/Hepatic Neg liver ROS,GERD  ,,  Endo/Other  diabetes    Renal/GU negative Renal ROS     Musculoskeletal   Abdominal   Peds  Hematology negative hematology ROS (+)   Anesthesia Other Findings   Reproductive/Obstetrics                             Anesthesia Physical Anesthesia Plan  ASA: 3  Anesthesia Plan: General   Post-op Pain Management: Tylenol PO (pre-op)* and Ketamine IV*   Induction: Intravenous  PONV Risk Score and Plan: 2 and Dexamethasone and Ondansetron  Airway Management Planned: Oral ETT and Awake Intubation Planned  Additional Equipment:   Intra-op Plan:   Post-operative Plan: Extubation in OR  Informed Consent: I have reviewed the patients History and Physical, chart, labs and discussed the procedure including the risks, benefits and alternatives for the proposed anesthesia with the patient or authorized representative who has indicated his/her understanding and acceptance.     Dental advisory given  Plan Discussed with:   Anesthesia Plan Comments: (Review of PET scan 01/20/2023 shows significant rightward tracheal deviation due to thyroid goiter which extends into the mediastinum.)        Anesthesia Quick Evaluation

## 2023-07-13 ENCOUNTER — Ambulatory Visit: Payer: Self-pay | Admitting: Surgery

## 2023-07-13 ENCOUNTER — Ambulatory Visit
Admission: RE | Admit: 2023-07-13 | Discharge: 2023-07-13 | Disposition: A | Discharge status: Home / Self Care | Payer: BC Managed Care – PPO | Source: Ambulatory Visit | Attending: Radiation Oncology | Admitting: Radiation Oncology

## 2023-07-13 ENCOUNTER — Other Ambulatory Visit: Payer: Self-pay

## 2023-07-13 DIAGNOSIS — M9903 Segmental and somatic dysfunction of lumbar region: Secondary | ICD-10-CM | POA: Diagnosis not present

## 2023-07-13 DIAGNOSIS — M9905 Segmental and somatic dysfunction of pelvic region: Secondary | ICD-10-CM | POA: Diagnosis not present

## 2023-07-13 DIAGNOSIS — C61 Malignant neoplasm of prostate: Secondary | ICD-10-CM | POA: Diagnosis not present

## 2023-07-13 DIAGNOSIS — M9904 Segmental and somatic dysfunction of sacral region: Secondary | ICD-10-CM | POA: Diagnosis not present

## 2023-07-13 DIAGNOSIS — Z510 Encounter for antineoplastic radiation therapy: Secondary | ICD-10-CM | POA: Diagnosis not present

## 2023-07-13 DIAGNOSIS — Z191 Hormone sensitive malignancy status: Secondary | ICD-10-CM | POA: Diagnosis not present

## 2023-07-13 DIAGNOSIS — M545 Low back pain, unspecified: Secondary | ICD-10-CM | POA: Diagnosis not present

## 2023-07-13 LAB — RAD ONC ARIA SESSION SUMMARY
Course Elapsed Days: 7
Plan Fractions Treated to Date: 6
Plan Prescribed Dose Per Fraction: 1.8 Gy
Plan Total Fractions Prescribed: 25
Plan Total Prescribed Dose: 45 Gy
Reference Point Dosage Given to Date: 10.8 Gy
Reference Point Session Dosage Given: 1.8 Gy
Session Number: 6

## 2023-07-13 NOTE — H&P (View-Only) (Signed)
REFERRING PHYSICIAN: Loreli Slot, MD  PROVIDER: Daryel Kenneth Myra Rude, MD   Chief Complaint: New Consultation (Substernal thyroid goiter)  History of Present Illness:  Patient is referred by Dr. Andrey Spearman for surgical evaluation and management of a dominant left thyroid nodule with substernal extension. Patient had been referred to thoracic surgery for odynophagia. Evaluation included a PET scan which had been performed in May 2024. This showed an enlarged left thyroid lobe extending into the mediastinum and resulting in tracheal and esophageal deviation. Patient underwent an ultrasound examination of the thyroid on April 18, 2023. This showed a normal right thyroid lobe. The left thyroid lobe had a dominant 5.5 cm nodule extending into the upper mediastinum. There is also a cystic lesion in the midline superior to the thyroid. Right lobe was heterogeneous but otherwise normal in size. Patient underwent ultrasound-guided fine-needle aspiration biopsy on May 16, 2023. This demonstrated findings consistent with a benign follicular nodule. TSH level is normal at 1.24. Patient has never been on thyroid medication. Patient has had no prior head or neck surgery. There is no family history of thyroid disease and specifically no history of thyroid cancer. Patient presents today to discuss left thyroid lobectomy. Patient is persistently symptomatic with a globus sensation. He has had at least 1 episode of dysphagia with pain. Patient does use CPAP for sleep apnea.   Review of Systems: A complete review of systems was obtained from the patient. I have reviewed this information and discussed as appropriate with the patient. See HPI as well for other ROS.  Review of Systems  Constitutional: Negative.  HENT: Negative.  Eyes: Negative.  Respiratory: Negative.  Cardiovascular: Negative.  Gastrointestinal:  Episode of dysphagia  Genitourinary:  Incontinence  Musculoskeletal:  Negative.  Skin: Negative.  Neurological: Negative.  Endo/Heme/Allergies: Negative.  Psychiatric/Behavioral: Negative.    Medical History: History reviewed. No pertinent past medical history.  Patient Active Problem List  Diagnosis  Essential hypertension  Malignant neoplasm of prostate (CMS/HHS-HCC)  Obesity  Palpitations  Type 2 diabetes mellitus (CMS/HHS-HCC)  Complex sleep apnea syndrome  Left thyroid nodule  Substernal thyroid goiter  Dysphagia   Past Surgical History:  Procedure Laterality Date  LAPAROSCOPIC PROSTATECTOMY 01/19/2018  lymphadenectomy 01/19/2018    No Known Allergies  Current Outpatient Medications on File Prior to Visit  Medication Sig Dispense Refill  atorvastatin (LIPITOR) 40 MG tablet  omeprazole (PRILOSEC) 20 MG DR capsule  colchicine (COLCRYS) 0.6 mg tablet Take by mouth  dapagliflozin-metFORMIN (XIGDUO XR) 10-1,000 mg XR 24 hr biphasic tablet Take 1 tablet by mouth every evening  diclofenac (VOLTAREN) 1 % topical gel Apply topically  FLUoxetine (PROZAC) 10 MG tablet Take 10 mg by mouth once daily  lisinopriL (ZESTRIL) 40 MG tablet Take 20 mg by mouth every evening  neomycin-bacitracnZn-polymyxnB 3.5-500-10,000 mg-unit-unit Oint Apply topically  nitroGLYcerin (NITROSTAT) 0.4 MG SL tablet Place 0.4 mg under the tongue every 5 (five) minutes as needed for Chest pain May take up to 3 doses.  omeprazole (PRILOSEC OTC) 20 MG EC tablet Take 20 mg by mouth once daily  SITagliptin phosphate (JANUVIA) 100 MG tablet Take 100 mg by mouth once daily  traMADoL (ULTRAM) 50 mg tablet Take 50 mg by mouth every 6 (six) hours as needed for Pain  turmeric, bulk, 95 % Powd Take 1 capsule by mouth every evening   No current facility-administered medications on file prior to visit.   Family History  Problem Relation Age of Onset  Liver disease Mother  Prostate cancer Father  Myocardial Infarction (Heart attack) Father    Social History   Tobacco Use   Smoking Status Not on file  Smokeless Tobacco Not on file    Social History   Socioeconomic History  Marital status: Married   Social Determinants of Health   Received from [employer]  Social Network   Objective:   Vitals:  BP: 136/68  Pulse: 93  Temp: 36.8 C (98.2 F)  SpO2: 98%  Weight: (!) 101.4 kg (223 lb 9.6 oz)  Height: 175.3 cm (5\' 9" )  PainSc: 0-No pain   Body mass index is 33.02 kg/m.  Physical Exam   GENERAL APPEARANCE Comfortable, no acute issues Development: normal Gross deformities: none  SKIN Rash, lesions, ulcers: none Induration, erythema: none Nodules: none palpable  EYES Conjunctiva and lids: normal Pupils: equal and reactive  EARS, NOSE, MOUTH, THROAT External ears: no lesion or deformity External nose: no lesion or deformity Hearing: grossly normal  NECK Symmetric: yes Trachea: midline Thyroid: Right thyroid lobe is normal to palpation. Palpation in the upper midline reveals a subtle nodule at the level of the thyroid cartilage likely representing the cyst seen on examination. Palpation on the left side reveals no discrete nodules although the upper portion of this mass may be palpable as it extends beneath the left clavicle. There is no associated lymphadenopathy.  ABDOMEN Not assessed  GENITOURINARY/RECTAL Not assessed  MUSCULOSKELETAL Station and gait: normal Digits and nails: no clubbing or cyanosis Muscle strength: grossly normal all extremities Range of motion: grossly normal all extremities Deformity: Mild tremor  LYMPHATIC Cervical: none palpable Supraclavicular: none palpable  PSYCHIATRIC Oriented to person, place, and time: yes Mood and affect: normal for situation Judgment and insight: appropriate for situation  Assessment and Plan:  Diagnoses and all orders for this visit:  Left thyroid nodule  Substernal thyroid goiter  Dysphagia, unspecified type   Patient is referred by his thoracic surgeon  for surgical evaluation and recommendations regarding an enlarged left thyroid nodule extending into the mediastinum and resulting in esophageal and tracheal deviation.  Patient provided with a copy of "The Thyroid Book: Medical and Surgical Treatment of Thyroid Problems", published by [employer], 16 pages. Book reviewed and explained to patient during visit today.  We reviewed his clinical history. We reviewed his PET scan and his ultrasound examination. We reviewed his fine-needle aspiration biopsy cytopathology results. We reviewed his laboratory studies. I believe the patient would be a good candidate for left thyroid lobectomy. I agree with Dr. Andrey Spearman that this can likely be managed through a cervical approach. However, to be cautious, we will plan to do this in a thoracic surgery room at [facility] in case involvement of thoracic surgery were necessary. We will ask Dr. Dorris Fetch to be available during the procedure. Today we discussed thyroid lobectomy. We discussed the size and location of the surgical incision. We discussed risk including the risk of recurrent laryngeal nerve injury. We discussed the hospital stay to be anticipated. We discussed his postoperative recovery and return to activities. The patient understands and would like to proceed in the near future.   Darnell Level, MD [facility] A [facility] practice Office: [phone]

## 2023-07-13 NOTE — H&P (Signed)
REFERRING PHYSICIAN: Loreli Slot, MD  PROVIDER: Daryel Kenneth Myra Rude, MD   Chief Complaint: New Consultation (Substernal thyroid goiter)  History of Present Illness:  Patient is referred by Dr. Andrey Spearman for surgical evaluation and management of a dominant left thyroid nodule with substernal extension. Patient had been referred to thoracic surgery for odynophagia. Evaluation included a PET scan which had been performed in May 2024. This showed an enlarged left thyroid lobe extending into the mediastinum and resulting in tracheal and esophageal deviation. Patient underwent an ultrasound examination of the thyroid on April 18, 2023. This showed a normal right thyroid lobe. The left thyroid lobe had a dominant 5.5 cm nodule extending into the upper mediastinum. There is also a cystic lesion in the midline superior to the thyroid. Right lobe was heterogeneous but otherwise normal in size. Patient underwent ultrasound-guided fine-needle aspiration biopsy on May 16, 2023. This demonstrated findings consistent with a benign follicular nodule. TSH level is normal at 1.24. Patient has never been on thyroid medication. Patient has had no prior head or neck surgery. There is no family history of thyroid disease and specifically no history of thyroid cancer. Patient presents today to discuss left thyroid lobectomy. Patient is persistently symptomatic with a globus sensation. He has had at least 1 episode of dysphagia with pain. Patient does use CPAP for sleep apnea.   Review of Systems: A complete review of systems was obtained from the patient. I have reviewed this information and discussed as appropriate with the patient. See HPI as well for other ROS.  Review of Systems  Constitutional: Negative.  HENT: Negative.  Eyes: Negative.  Respiratory: Negative.  Cardiovascular: Negative.  Gastrointestinal:  Episode of dysphagia  Genitourinary:  Incontinence  Musculoskeletal:  Negative.  Skin: Negative.  Neurological: Negative.  Endo/Heme/Allergies: Negative.  Psychiatric/Behavioral: Negative.    Medical History: History reviewed. No pertinent past medical history.  Patient Active Problem List  Diagnosis  Essential hypertension  Malignant neoplasm of prostate (CMS/HHS-HCC)  Obesity  Palpitations  Type 2 diabetes mellitus (CMS/HHS-HCC)  Complex sleep apnea syndrome  Left thyroid nodule  Substernal thyroid goiter  Dysphagia   Past Surgical History:  Procedure Laterality Date  LAPAROSCOPIC PROSTATECTOMY 01/19/2018  lymphadenectomy 01/19/2018    No Known Allergies  Current Outpatient Medications on File Prior to Visit  Medication Sig Dispense Refill  atorvastatin (LIPITOR) 40 MG tablet  omeprazole (PRILOSEC) 20 MG DR capsule  colchicine (COLCRYS) 0.6 mg tablet Take by mouth  dapagliflozin-metFORMIN (XIGDUO XR) 10-1,000 mg XR 24 hr biphasic tablet Take 1 tablet by mouth every evening  diclofenac (VOLTAREN) 1 % topical gel Apply topically  FLUoxetine (PROZAC) 10 MG tablet Take 10 mg by mouth once daily  lisinopriL (ZESTRIL) 40 MG tablet Take 20 mg by mouth every evening  neomycin-bacitracnZn-polymyxnB 3.5-500-10,000 mg-unit-unit Oint Apply topically  nitroGLYcerin (NITROSTAT) 0.4 MG SL tablet Place 0.4 mg under the tongue every 5 (five) minutes as needed for Chest pain May take up to 3 doses.  omeprazole (PRILOSEC OTC) 20 MG EC tablet Take 20 mg by mouth once daily  SITagliptin phosphate (JANUVIA) 100 MG tablet Take 100 mg by mouth once daily  traMADoL (ULTRAM) 50 mg tablet Take 50 mg by mouth every 6 (six) hours as needed for Pain  turmeric, bulk, 95 % Powd Take 1 capsule by mouth every evening   No current facility-administered medications on file prior to visit.   Family History  Problem Relation Age of Onset  Liver disease Mother  Prostate cancer Father  Myocardial Infarction (Heart attack) Father    Social History   Tobacco Use   Smoking Status Not on file  Smokeless Tobacco Not on file    Social History   Socioeconomic History  Marital status: Married   Social Determinants of Health   Received from [employer]  Social Network   Objective:   Vitals:  BP: 136/68  Pulse: 93  Temp: 36.8 C (98.2 F)  SpO2: 98%  Weight: (!) 101.4 kg (223 lb 9.6 oz)  Height: 175.3 cm (5\' 9" )  PainSc: 0-No pain   Body mass index is 33.02 kg/m.  Physical Exam   GENERAL APPEARANCE Comfortable, no acute issues Development: normal Gross deformities: none  SKIN Rash, lesions, ulcers: none Induration, erythema: none Nodules: none palpable  EYES Conjunctiva and lids: normal Pupils: equal and reactive  EARS, NOSE, MOUTH, THROAT External ears: no lesion or deformity External nose: no lesion or deformity Hearing: grossly normal  NECK Symmetric: yes Trachea: midline Thyroid: Right thyroid lobe is normal to palpation. Palpation in the upper midline reveals a subtle nodule at the level of the thyroid cartilage likely representing the cyst seen on examination. Palpation on the left side reveals no discrete nodules although the upper portion of this mass may be palpable as it extends beneath the left clavicle. There is no associated lymphadenopathy.  ABDOMEN Not assessed  GENITOURINARY/RECTAL Not assessed  MUSCULOSKELETAL Station and gait: normal Digits and nails: no clubbing or cyanosis Muscle strength: grossly normal all extremities Range of motion: grossly normal all extremities Deformity: Mild tremor  LYMPHATIC Cervical: none palpable Supraclavicular: none palpable  PSYCHIATRIC Oriented to person, place, and time: yes Mood and affect: normal for situation Judgment and insight: appropriate for situation  Assessment and Plan:  Diagnoses and all orders for this visit:  Left thyroid nodule  Substernal thyroid goiter  Dysphagia, unspecified type   Patient is referred by his thoracic surgeon  for surgical evaluation and recommendations regarding an enlarged left thyroid nodule extending into the mediastinum and resulting in esophageal and tracheal deviation.  Patient provided with a copy of "The Thyroid Book: Medical and Surgical Treatment of Thyroid Problems", published by [employer], 16 pages. Book reviewed and explained to patient during visit today.  We reviewed his clinical history. We reviewed his PET scan and his ultrasound examination. We reviewed his fine-needle aspiration biopsy cytopathology results. We reviewed his laboratory studies. I believe the patient would be a good candidate for left thyroid lobectomy. I agree with Dr. Andrey Spearman that this can likely be managed through a cervical approach. However, to be cautious, we will plan to do this in a thoracic surgery room at [facility] in case involvement of thoracic surgery were necessary. We will ask Dr. Dorris Fetch to be available during the procedure. Today we discussed thyroid lobectomy. We discussed the size and location of the surgical incision. We discussed risk including the risk of recurrent laryngeal nerve injury. We discussed the hospital stay to be anticipated. We discussed his postoperative recovery and return to activities. The patient understands and would like to proceed in the near future.   Darnell Level, MD [facility] Surgery A [facility] practice Office: [phone]

## 2023-07-14 ENCOUNTER — Ambulatory Visit
Admission: RE | Admit: 2023-07-14 | Discharge: 2023-07-14 | Disposition: A | Discharge status: Home / Self Care | Payer: BC Managed Care – PPO | Source: Ambulatory Visit | Attending: Radiation Oncology

## 2023-07-14 ENCOUNTER — Ambulatory Visit
Admission: RE | Admit: 2023-07-14 | Discharge: 2023-07-14 | Disposition: A | Discharge status: Home / Self Care | Payer: BC Managed Care – PPO | Source: Ambulatory Visit | Attending: Radiation Oncology | Admitting: Radiation Oncology

## 2023-07-14 ENCOUNTER — Other Ambulatory Visit: Payer: Self-pay

## 2023-07-14 DIAGNOSIS — Z191 Hormone sensitive malignancy status: Secondary | ICD-10-CM | POA: Diagnosis not present

## 2023-07-14 DIAGNOSIS — Z510 Encounter for antineoplastic radiation therapy: Secondary | ICD-10-CM | POA: Diagnosis not present

## 2023-07-14 DIAGNOSIS — C61 Malignant neoplasm of prostate: Secondary | ICD-10-CM | POA: Diagnosis not present

## 2023-07-14 LAB — RAD ONC ARIA SESSION SUMMARY
Course Elapsed Days: 8
Plan Fractions Treated to Date: 7
Plan Prescribed Dose Per Fraction: 1.8 Gy
Plan Total Fractions Prescribed: 25
Plan Total Prescribed Dose: 45 Gy
Reference Point Dosage Given to Date: 12.6 Gy
Reference Point Session Dosage Given: 1.8 Gy
Session Number: 7

## 2023-07-15 ENCOUNTER — Encounter (HOSPITAL_COMMUNITY): Payer: Self-pay | Admitting: Surgery

## 2023-07-15 DIAGNOSIS — R1310 Dysphagia, unspecified: Secondary | ICD-10-CM

## 2023-07-15 DIAGNOSIS — E049 Nontoxic goiter, unspecified: Secondary | ICD-10-CM | POA: Diagnosis present

## 2023-07-15 DIAGNOSIS — E041 Nontoxic single thyroid nodule: Secondary | ICD-10-CM | POA: Diagnosis present

## 2023-07-17 ENCOUNTER — Encounter (HOSPITAL_COMMUNITY)
Admission: RE | Disposition: A | Discharge status: Home / Self Care | Payer: Self-pay | Source: Home / Self Care | Attending: Surgery

## 2023-07-17 ENCOUNTER — Ambulatory Visit (HOSPITAL_COMMUNITY): Payer: BC Managed Care – PPO | Admitting: Anesthesiology

## 2023-07-17 ENCOUNTER — Ambulatory Visit: Payer: BC Managed Care – PPO

## 2023-07-17 ENCOUNTER — Ambulatory Visit (HOSPITAL_COMMUNITY)
Admission: RE | Admit: 2023-07-17 | Discharge: 2023-07-18 | Disposition: A | Discharge status: Home / Self Care | Payer: BC Managed Care – PPO | Attending: Surgery | Admitting: Surgery

## 2023-07-17 ENCOUNTER — Encounter (HOSPITAL_COMMUNITY): Payer: Self-pay | Admitting: Surgery

## 2023-07-17 ENCOUNTER — Other Ambulatory Visit: Payer: Self-pay

## 2023-07-17 ENCOUNTER — Ambulatory Visit (HOSPITAL_COMMUNITY): Payer: BC Managed Care – PPO | Admitting: Physician Assistant

## 2023-07-17 DIAGNOSIS — Z8546 Personal history of malignant neoplasm of prostate: Secondary | ICD-10-CM | POA: Diagnosis not present

## 2023-07-17 DIAGNOSIS — R1310 Dysphagia, unspecified: Secondary | ICD-10-CM | POA: Diagnosis not present

## 2023-07-17 DIAGNOSIS — Z6833 Body mass index (BMI) 33.0-33.9, adult: Secondary | ICD-10-CM | POA: Insufficient documentation

## 2023-07-17 DIAGNOSIS — E049 Nontoxic goiter, unspecified: Secondary | ICD-10-CM | POA: Diagnosis present

## 2023-07-17 DIAGNOSIS — E041 Nontoxic single thyroid nodule: Secondary | ICD-10-CM | POA: Diagnosis not present

## 2023-07-17 DIAGNOSIS — I10 Essential (primary) hypertension: Secondary | ICD-10-CM | POA: Insufficient documentation

## 2023-07-17 DIAGNOSIS — E669 Obesity, unspecified: Secondary | ICD-10-CM | POA: Insufficient documentation

## 2023-07-17 DIAGNOSIS — E119 Type 2 diabetes mellitus without complications: Secondary | ICD-10-CM | POA: Insufficient documentation

## 2023-07-17 DIAGNOSIS — Z7984 Long term (current) use of oral hypoglycemic drugs: Secondary | ICD-10-CM | POA: Diagnosis not present

## 2023-07-17 DIAGNOSIS — E042 Nontoxic multinodular goiter: Secondary | ICD-10-CM | POA: Diagnosis not present

## 2023-07-17 DIAGNOSIS — Q892 Congenital malformations of other endocrine glands: Secondary | ICD-10-CM | POA: Diagnosis not present

## 2023-07-17 HISTORY — PX: THYROID LOBECTOMY: SHX420

## 2023-07-17 LAB — GLUCOSE, CAPILLARY
Glucose-Capillary: 126 mg/dL — ABNORMAL HIGH (ref 70–99)
Glucose-Capillary: 159 mg/dL — ABNORMAL HIGH (ref 70–99)

## 2023-07-17 SURGERY — LOBECTOMY, THYROID
Anesthesia: General | Site: Neck | Laterality: Left

## 2023-07-17 MED ORDER — DAPAGLIFLOZIN PROPANEDIOL 10 MG PO TABS: 10.0000 mg | ORAL_TABLET | Freq: Every day | ORAL | Status: DC

## 2023-07-17 MED ORDER — FENTANYL CITRATE (PF) 100 MCG/2ML IJ SOLN
INTRAMUSCULAR | Status: AC
  Filled 2023-07-17: qty 2

## 2023-07-17 MED ORDER — DEXAMETHASONE SODIUM PHOSPHATE 10 MG/ML IJ SOLN
INTRAMUSCULAR | Status: AC
  Filled 2023-07-17: qty 1

## 2023-07-17 MED ORDER — SODIUM CHLORIDE 0.9 % IV SOLN
0.1500 ug/kg/min | INTRAVENOUS | Status: DC
  Administered 2023-07-17: .15 ug/kg/min via INTRAVENOUS
  Filled 2023-07-17: qty 5000

## 2023-07-17 MED ORDER — LIDOCAINE HCL 4 % EX SOLN
CUTANEOUS | Status: DC | PRN
  Administered 2023-07-17: 4 mL via TOPICAL

## 2023-07-17 MED ORDER — OXYCODONE HCL 5 MG PO TABS
5.0000 mg | ORAL_TABLET | ORAL | Status: DC | PRN
  Administered 2023-07-17 – 2023-07-18 (×2): 10 mg via ORAL
  Filled 2023-07-17 (×2): qty 2

## 2023-07-17 MED ORDER — SODIUM CHLORIDE 0.9 % IV SOLN: INTRAVENOUS | Status: DC | PRN

## 2023-07-17 MED ORDER — DEXTROSE-SODIUM CHLORIDE 5-0.9 % IV SOLN: INTRAVENOUS | Status: DC

## 2023-07-17 MED ORDER — ACETAMINOPHEN 650 MG RE SUPP: 650.0000 mg | Freq: Four times a day (QID) | RECTAL | Status: DC | PRN

## 2023-07-17 MED ORDER — KETAMINE HCL 50 MG/5ML IJ SOSY
PREFILLED_SYRINGE | INTRAMUSCULAR | Status: AC
  Filled 2023-07-17: qty 5

## 2023-07-17 MED ORDER — PANTOPRAZOLE SODIUM 40 MG PO TBEC
40.0000 mg | DELAYED_RELEASE_TABLET | Freq: Every day | ORAL | Status: DC
  Filled 2023-07-17: qty 1

## 2023-07-17 MED ORDER — SUGAMMADEX SODIUM 200 MG/2ML IV SOLN
INTRAVENOUS | Status: DC | PRN
  Administered 2023-07-17: 200 mg via INTRAVENOUS

## 2023-07-17 MED ORDER — TRAMADOL HCL 50 MG PO TABS
50.0000 mg | ORAL_TABLET | Freq: Four times a day (QID) | ORAL | Status: DC | PRN
  Administered 2023-07-17: 50 mg via ORAL
  Filled 2023-07-17: qty 1

## 2023-07-17 MED ORDER — PROPOFOL 10 MG/ML IV BOLUS
INTRAVENOUS | Status: AC
  Filled 2023-07-17: qty 20

## 2023-07-17 MED ORDER — FENTANYL CITRATE (PF) 250 MCG/5ML IJ SOLN
INTRAMUSCULAR | Status: DC | PRN
  Administered 2023-07-17 (×4): 50 ug via INTRAVENOUS

## 2023-07-17 MED ORDER — LACTATED RINGERS IV SOLN: INTRAVENOUS | Status: DC | PRN

## 2023-07-17 MED ORDER — CHLORHEXIDINE GLUCONATE CLOTH 2 % EX PADS: 6.0000 | MEDICATED_PAD | Freq: Once | CUTANEOUS | Status: DC

## 2023-07-17 MED ORDER — HEMOSTATIC AGENTS (NO CHARGE) OPTIME
TOPICAL | Status: DC | PRN
  Administered 2023-07-17: 1 via TOPICAL

## 2023-07-17 MED ORDER — LISINOPRIL 10 MG PO TABS
10.0000 mg | ORAL_TABLET | Freq: Every day | ORAL | Status: DC
  Administered 2023-07-17: 10 mg via ORAL
  Filled 2023-07-17 (×2): qty 1

## 2023-07-17 MED ORDER — EPHEDRINE 5 MG/ML INJ
INTRAVENOUS | Status: AC
  Filled 2023-07-17: qty 5

## 2023-07-17 MED ORDER — ACETAMINOPHEN 325 MG PO TABS
650.0000 mg | ORAL_TABLET | Freq: Four times a day (QID) | ORAL | Status: DC | PRN
  Administered 2023-07-17: 650 mg via ORAL
  Filled 2023-07-17: qty 2

## 2023-07-17 MED ORDER — ROCURONIUM BROMIDE 10 MG/ML (PF) SYRINGE
PREFILLED_SYRINGE | INTRAVENOUS | Status: AC
  Filled 2023-07-17: qty 10

## 2023-07-17 MED ORDER — ROCURONIUM BROMIDE 10 MG/ML (PF) SYRINGE
PREFILLED_SYRINGE | INTRAVENOUS | Status: DC | PRN
  Administered 2023-07-17: 50 mg via INTRAVENOUS
  Administered 2023-07-17: 30 mg via INTRAVENOUS

## 2023-07-17 MED ORDER — MIDAZOLAM HCL 2 MG/2ML IJ SOLN
INTRAMUSCULAR | Status: DC | PRN
  Administered 2023-07-17 (×2): 1 mg via INTRAVENOUS

## 2023-07-17 MED ORDER — 0.9 % SODIUM CHLORIDE (POUR BTL) OPTIME
TOPICAL | Status: DC | PRN
  Administered 2023-07-17: 1000 mL

## 2023-07-17 MED ORDER — ONDANSETRON HCL 4 MG/2ML IJ SOLN: 4.0000 mg | Freq: Four times a day (QID) | INTRAMUSCULAR | Status: DC | PRN

## 2023-07-17 MED ORDER — ORAL CARE MOUTH RINSE
15.0000 mL | Freq: Once | OROMUCOSAL | Status: AC
Start: 2023-07-17 — End: 2023-07-17

## 2023-07-17 MED ORDER — INSULIN ASPART 100 UNIT/ML IJ SOLN: 0.0000 [IU] | INTRAMUSCULAR | Status: DC | PRN

## 2023-07-17 MED ORDER — CIPROFLOXACIN IN D5W 400 MG/200ML IV SOLN
400.0000 mg | INTRAVENOUS | Status: AC
  Administered 2023-07-17: 400 mg via INTRAVENOUS
  Filled 2023-07-17: qty 200

## 2023-07-17 MED ORDER — FLUOXETINE HCL 20 MG PO CAPS
40.0000 mg | ORAL_CAPSULE | Freq: Every day | ORAL | Status: DC
  Filled 2023-07-17: qty 2

## 2023-07-17 MED ORDER — ONDANSETRON HCL 4 MG/2ML IJ SOLN
INTRAMUSCULAR | Status: DC | PRN
  Administered 2023-07-17: 4 mg via INTRAVENOUS

## 2023-07-17 MED ORDER — LIDOCAINE 2% (20 MG/ML) 5 ML SYRINGE
INTRAMUSCULAR | Status: AC
  Filled 2023-07-17: qty 5

## 2023-07-17 MED ORDER — PHENYLEPHRINE 80 MCG/ML (10ML) SYRINGE FOR IV PUSH (FOR BLOOD PRESSURE SUPPORT)
PREFILLED_SYRINGE | INTRAVENOUS | Status: AC
  Filled 2023-07-17: qty 10

## 2023-07-17 MED ORDER — ONDANSETRON HCL 4 MG/2ML IJ SOLN
INTRAMUSCULAR | Status: AC
  Filled 2023-07-17: qty 2

## 2023-07-17 MED ORDER — CHLORHEXIDINE GLUCONATE 0.12 % MT SOLN
15.0000 mL | Freq: Once | OROMUCOSAL | Status: AC
  Administered 2023-07-17: 15 mL via OROMUCOSAL
  Filled 2023-07-17: qty 15

## 2023-07-17 MED ORDER — PROPOFOL 10 MG/ML IV BOLUS
INTRAVENOUS | Status: DC | PRN
  Administered 2023-07-17: 150 mg via INTRAVENOUS
  Administered 2023-07-17: 30 mg via INTRAVENOUS

## 2023-07-17 MED ORDER — METFORMIN HCL ER 500 MG PO TB24
1000.0000 mg | ORAL_TABLET | Freq: Every day | ORAL | Status: DC
  Administered 2023-07-17: 1000 mg via ORAL
  Filled 2023-07-17: qty 2

## 2023-07-17 MED ORDER — PHENYLEPHRINE HCL-NACL 20-0.9 MG/250ML-% IV SOLN
INTRAVENOUS | Status: DC | PRN
  Administered 2023-07-17: 30 ug/min via INTRAVENOUS

## 2023-07-17 MED ORDER — BUTAMBEN-TETRACAINE-BENZOCAINE 2-2-14 % EX AERO
INHALATION_SPRAY | CUTANEOUS | Status: DC | PRN
  Administered 2023-07-17: 2 via TOPICAL

## 2023-07-17 MED ORDER — DEXAMETHASONE SODIUM PHOSPHATE 10 MG/ML IJ SOLN
INTRAMUSCULAR | Status: DC | PRN
  Administered 2023-07-17: 10 mg via INTRAVENOUS

## 2023-07-17 MED ORDER — ACETAMINOPHEN 500 MG PO TABS
1000.0000 mg | ORAL_TABLET | Freq: Once | ORAL | Status: AC
  Administered 2023-07-17: 1000 mg via ORAL
  Filled 2023-07-17: qty 2

## 2023-07-17 MED ORDER — KETAMINE HCL 10 MG/ML IJ SOLN
INTRAMUSCULAR | Status: DC | PRN
Start: 2023-07-17 — End: 2023-07-17
  Administered 2023-07-17: 10 mg via INTRAVENOUS

## 2023-07-17 MED ORDER — MIDAZOLAM HCL 2 MG/2ML IJ SOLN
INTRAMUSCULAR | Status: AC
  Filled 2023-07-17: qty 2

## 2023-07-17 MED ORDER — HYDROMORPHONE HCL 1 MG/ML IJ SOLN
1.0000 mg | INTRAMUSCULAR | Status: DC | PRN
  Administered 2023-07-17 – 2023-07-18 (×5): 1 mg via INTRAVENOUS
  Filled 2023-07-17 (×5): qty 1

## 2023-07-17 MED ORDER — DAPAGLIFLOZIN PRO-METFORMIN ER 10-1000 MG PO TB24: 1.0000 | ORAL_TABLET | Freq: Every evening | ORAL | Status: DC

## 2023-07-17 MED ORDER — PHENYLEPHRINE 80 MCG/ML (10ML) SYRINGE FOR IV PUSH (FOR BLOOD PRESSURE SUPPORT)
PREFILLED_SYRINGE | INTRAVENOUS | Status: DC | PRN
  Administered 2023-07-17: 160 ug via INTRAVENOUS

## 2023-07-17 MED ORDER — FENTANYL CITRATE (PF) 100 MCG/2ML IJ SOLN
25.0000 ug | INTRAMUSCULAR | Status: DC | PRN
  Administered 2023-07-17: 50 ug via INTRAVENOUS

## 2023-07-17 MED ORDER — EPHEDRINE SULFATE-NACL 50-0.9 MG/10ML-% IV SOSY
PREFILLED_SYRINGE | INTRAVENOUS | Status: DC | PRN
  Administered 2023-07-17 (×2): 5 mg via INTRAVENOUS

## 2023-07-17 MED ORDER — FENTANYL CITRATE (PF) 250 MCG/5ML IJ SOLN
INTRAMUSCULAR | Status: AC
  Filled 2023-07-17: qty 5

## 2023-07-17 MED ORDER — ONDANSETRON 4 MG PO TBDP: 4.0000 mg | ORAL_TABLET | Freq: Four times a day (QID) | ORAL | Status: DC | PRN

## 2023-07-17 SURGICAL SUPPLY — 52 items
ADH SKN CLS APL DERMABOND .7 (GAUZE/BANDAGES/DRESSINGS) ×2
APL PRP STRL LF ISPRP CHG 10.5 (MISCELLANEOUS) ×2
APPLICATOR CHLORAPREP 10.5 ORG (MISCELLANEOUS) ×2 IMPLANT
ATTRACTOMAT 16X20 MAGNETIC DRP (DRAPES) ×2 IMPLANT
BAG COUNTER SPONGE SURGICOUNT (BAG) ×2 IMPLANT
BAG SPNG CNTER NS LX DISP (BAG) ×2
BLADE CLIPPER SURG (BLADE) ×1 IMPLANT
BLADE SURG 10 STRL SS (BLADE) ×2 IMPLANT
BLADE SURG 15 STRL LF DISP TIS (BLADE) ×2 IMPLANT
BLADE SURG 15 STRL SS (BLADE) ×2
CANISTER SUCT 3000ML PPV (MISCELLANEOUS) ×2 IMPLANT
CLIP TI MEDIUM 24 (CLIP) ×2 IMPLANT
CLIP TI WIDE RED SMALL 24 (CLIP) ×2 IMPLANT
CNTNR URN SCR LID CUP LEK RST (MISCELLANEOUS) ×1 IMPLANT
CONT SPEC 4OZ STRL OR WHT (MISCELLANEOUS) ×2
COVER SURGICAL LIGHT HANDLE (MISCELLANEOUS) ×2 IMPLANT
DERMABOND ADVANCED .7 DNX12 (GAUZE/BANDAGES/DRESSINGS) ×2 IMPLANT
DISSECTOR SURG LIGASURE 21 (MISCELLANEOUS) IMPLANT
DRAPE LAPAROTOMY 100X72 PEDS (DRAPES) ×2 IMPLANT
DRAPE UTILITY XL STRL (DRAPES) ×2 IMPLANT
DRAPE WARM FLUID 44X44 (DRAPES) ×1 IMPLANT
ELECT CAUTERY BLADE 6.4 (BLADE) ×2 IMPLANT
ELECT REM PT RETURN 9FT ADLT (ELECTROSURGICAL) ×2
ELECTRODE REM PT RTRN 9FT ADLT (ELECTROSURGICAL) ×1 IMPLANT
GAUZE 4X4 16PLY ~~LOC~~+RFID DBL (SPONGE) ×2 IMPLANT
GLOVE SURG ORTHO 8.0 STRL STRW (GLOVE) ×2 IMPLANT
GOWN STRL REUS W/ TWL LRG LVL3 (GOWN DISPOSABLE) ×2 IMPLANT
GOWN STRL REUS W/ TWL XL LVL3 (GOWN DISPOSABLE) ×3 IMPLANT
GOWN STRL REUS W/TWL LRG LVL3 (GOWN DISPOSABLE) ×2
GOWN STRL REUS W/TWL XL LVL3 (GOWN DISPOSABLE) ×4
HEMOSTAT SURGICEL 2X4 FIBR (HEMOSTASIS) ×2 IMPLANT
ILLUMINATOR WAVEGUIDE N/F (MISCELLANEOUS) ×2 IMPLANT
KIT BASIN OR (CUSTOM PROCEDURE TRAY) ×2 IMPLANT
KIT TURNOVER KIT B (KITS) ×2 IMPLANT
NS IRRIG 1000ML POUR BTL (IV SOLUTION) ×2 IMPLANT
PACK BASIC III (CUSTOM PROCEDURE TRAY) ×2
PACK SRG BSC III STRL LF ECLPS (CUSTOM PROCEDURE TRAY) ×2 IMPLANT
PAD ARMBOARD 7.5X6 YLW CONV (MISCELLANEOUS) ×2 IMPLANT
PENCIL BUTTON HOLSTER BLD 10FT (ELECTRODE) ×2 IMPLANT
POSITIONER HEAD DONUT 9IN (MISCELLANEOUS) ×2 IMPLANT
SHEARS HARMONIC 9CM CVD (BLADE) ×2 IMPLANT
SPONGE INTESTINAL PEANUT (DISPOSABLE) ×2 IMPLANT
SUT MNCRL AB 4-0 PS2 18 (SUTURE) ×2 IMPLANT
SUT SILK 2 0 (SUTURE) ×2
SUT SILK 2-0 18XBRD TIE 12 (SUTURE) ×2 IMPLANT
SUT VIC AB 3-0 SH 18 (SUTURE) ×3 IMPLANT
SYR BULB IRRIG 60ML STRL (SYRINGE) ×2 IMPLANT
TOWEL GREEN STERILE (TOWEL DISPOSABLE) ×2 IMPLANT
TOWEL GREEN STERILE FF (TOWEL DISPOSABLE) ×3 IMPLANT
TUBE CONNECTING 12X1/4 (SUCTIONS) ×4 IMPLANT
VALVE BIOPSY SINGLE USE (MISCELLANEOUS) ×1 IMPLANT
VALVE SUCTION BRONCHIO DISP (MISCELLANEOUS) ×1 IMPLANT

## 2023-07-17 NOTE — Transfer of Care (Signed)
Immediate Anesthesia Transfer of Care Note  Patient: Shawn Hicks  Procedure(s) Performed: LEFT THYROID LOBECTOMY WITH SUBSTERNAL GOITER RESECTION, RESECTION OF THRYOGLOSSAL CYST (Left: Neck)  Patient Location: PACU  Anesthesia Type:General  Level of Consciousness: drowsy and patient cooperative  Airway & Oxygen Therapy: Patient Spontanous Breathing  Post-op Assessment: Report given to RN, Post -op Vital signs reviewed and stable, and Patient moving all extremities X 4  Post vital signs: Reviewed and stable  Last Vitals:  Vitals Value Taken Time  BP 183/95 07/17/23 1117  Temp 36.9 C 07/17/23 1115  Pulse 90 07/17/23 1121  Resp 11 07/17/23 1121  SpO2 96 % 07/17/23 1121  Vitals shown include unfiled device data.  Last Pain:  Vitals:   07/17/23 0753  TempSrc:   PainSc: 0-No pain      Patients Stated Pain Goal: 0 (07/17/23 0753)  Complications:  Encounter Notable Events  Notable Event Outcome Phase Comment  Difficult to intubate - expected  Intraprocedure Filed from anesthesia note documentation.

## 2023-07-17 NOTE — Op Note (Signed)
Procedure Note  Pre-operative Diagnosis:  substernal thyroid goiter, multiple thyroid nodules, thyroglossal duct cyst  Post-operative Diagnosis:  same  Surgeon:  Darnell Level, MD  Assistant:  none   Procedure:  Left thyroid lobectomy and isthmusectomy; resection of substernal goiter; resection of thyroglossal duct cyst  Anesthesia:  General  Estimated Blood Loss:  40 cc  Drains: none         Specimen: thyroid lobe and substernal goiter and thyroglossal duct cyst to pathology (en bloc resection)  Indications:  Patient is referred by Dr. Andrey Spearman for surgical evaluation and management of a dominant left thyroid nodule with substernal extension. Patient had been referred to thoracic surgery for odynophagia. Evaluation included a PET scan which had been performed in May 2024. This showed an enlarged left thyroid lobe extending into the mediastinum and resulting in tracheal and esophageal deviation. Patient underwent an ultrasound examination of the thyroid on April 18, 2023. This showed a normal right thyroid lobe. The left thyroid lobe had a dominant 5.5 cm nodule extending into the upper mediastinum. There is also a cystic lesion in the midline superior to the thyroid. Right lobe was heterogeneous but otherwise normal in size. Patient underwent ultrasound-guided fine-needle aspiration biopsy on May 16, 2023. This demonstrated findings consistent with a benign follicular nodule. TSH level is normal at 1.24. Patient has never been on thyroid medication. Patient has had no prior head or neck surgery. There is no family history of thyroid disease and specifically no history of thyroid cancer. Patient presents today to discuss left thyroid lobectomy.   Procedure Details: Procedure was done in OR #15 at the [facility]. The patient was brought to the operating room and placed in a supine position on the operating room table. Following administration of general anesthesia, the patient  was positioned and then prepped and draped in the usual aseptic fashion. After ascertaining that an adequate level of anesthesia had been achieved, a small Kocher incision was made with #15 blade. Dissection was carried through subcutaneous tissues and platysma. Hemostasis was achieved with the electrocautery. Skin flaps were elevated cephalad and caudad from the thyroid notch to the sternal notch. A self-retaining retractor was placed for exposure. Strap muscles were incised in the midline and dissection was begun on the left side.  The left thyroid lobe appeared multinodular.  Strap muscles were reflected laterally and the thyroid lobe was mobilized using the harmonic scalpel.  Superior pole vessels were dissected out individually and divided between small and medium ligaclips with the harmonic scalpel.  Gland was rolled anteriorly and the middle thyroid vein was divided between medium ligaclips with the harmonic scalpel.  Inferiorly the anterior aspect of the thyroid tissue was freed from the surrounding tissue using the harmonic scalpel.  Using gentle blunt dissection the plane into the mediastinum was developed allowing for mobilization of the substernal thyroid goiter.  Visible venous tributaries were divided between ligaclips with the harmonic scalpel.  The substernal goiter was then gently elevated using a [employer] retractor and the substernal goiter was delivered up and into the neck.  Gland was rolled anteriorly and branches of the inferior thyroid artery were divided between small ligaclips with the harmonic scalpel.  Ligament of Allyson Sabal was divided carefully with the electrocautery allowing for mobilization of the isthmus off of the anterior trachea.  Dissection was carried across the midline and the thyroid parenchyma was transected with the harmonic scalpel at the junction of the isthmus and the right thyroid lobe.  Left  thyroid bed and upper mediastinum were irrigated with warm saline and good  hemostasis was achieved.  Next the pyramidal lobe was dissected out.  Dissection was carried cephalad along the anterior surface of the thyroid cartilage.  Dissection was carried up to the thyroid notch.  The thyroglossal duct cyst was identified.  During the dissection it was ruptured and contained dark brownish-gray fluid which was evacuated.  Cyst was then excised using the harmonic scalpel.  Specimen was marked with sutures to indicate the thyroglossal duct cyst, the substernal thyroid goiter, and the thyroid isthmus margin.  The entire specimen was submitted en bloc to pathology for review.  The entire field was palpated for evidence of lymphadenopathy or extra-thyroidal disease.  No worrisome findings were noted.  No enlarged lymph nodes were identified.  The neck was irrigated with warm saline. Fibrillar was placed throughout the operative field. Strap muscles were approximated in the midline with interrupted 3-0 Vicryl sutures. Platysma was closed with interrupted 3-0 Vicryl sutures. Skin was closed with a running 4-0 Monocryl subcuticular suture.  Wound was washed and dried and Dermabond was applied. The patient was awakened from anesthesia and brought to the recovery room. The patient tolerated the procedure well.   Darnell Level, MD [facility] Surgery Office: [phone]

## 2023-07-17 NOTE — H&P (Signed)
Shawn Hicks is an 57 y.o. male.    Chief Complaint: dysphagia  HPI: Shawn Hicks is a 57 year old man with a past history of tobacco abuse, hypertension, hyperlipidemia, gout, type 2 diabetes without complication, obstructive sleep apnea, prostate cancer, and a goiter.   He has had issues with dysphagia dating back a couple of years.  Mostly this was the sensation of food sticking in his esophagus.  At 1 point recently he was having a dyne aphasia as well with pain even when swallowing liquids.  In May he had a PSMA PET because of an elevated PSA.  There was an incidental CT finding of an enlarged left lobe of the thyroid with a goiter extending into the thoracic inlet.  Past Medical History:  Diagnosis Date   Back pain    Bilateral medial epicondylitis of elbow joint    Boil    under arm right   Chest pain    Depression    Diabetes mellitus without complication (HCC)    ED (erectile dysfunction)    Elevated PSA    GERD (gastroesophageal reflux disease)    Gout    Hyperlipidemia    Hypertension    Migraine    Occular   Obesity    OSA on CPAP    Palpitations    Pneumonia    Hospitalized childhood   Prostate cancer (HCC)    Tobacco abuse     Past Surgical History:  Procedure Laterality Date   DENTAL SURGERY     Wisdom teeth   LYMPHADENECTOMY N/A 01/19/2018   Procedure: BILATERAL LYMPHADENECTOMY;  Surgeon: Crist Fat, MD;  Location: [facility] ORS;  Service: Urology;  Laterality: N/A;   PROSTATE BIOPSY     ROBOT ASSISTED LAPAROSCOPIC RADICAL PROSTATECTOMY N/A 01/19/2018   Procedure: XI ROBOTIC ASSISTED LAPAROSCOPIC RADICAL PROSTATECTOMY;  Surgeon: Crist Fat, MD;  Location: [facility] ORS;  Service: Urology;  Laterality: N/A;    Family History  Problem Relation Age of Onset   Liver disease Mother    Cancer Father        prostate cancer   Heart attack Father    Sleep apnea Brother    Cancer Maternal Grandfather        lung cancer/smoker   Social History:  reports  that he quit smoking about 5 years ago. His smoking use included cigars. He has never used smokeless tobacco. He reports current alcohol use of about 3.0 standard drinks of alcohol per week. He reports that he does not currently use drugs.  Allergies:  Allergies  Allergen Reactions   Allopurinol     Other Reaction(s): bp went up   Cephalosporins     Other Reaction(s): need to confirm which abx he was given perioperatively 5/19 and the week after. slight rash. so ,it may have been the bactrim , it may have been a perioperative abx with prostate surgery which was likely a cephalosporin   Clindamycin     Other Reaction(s): just felt bad on it. 3/18   Sulfamethoxazole-Trimethoprim Hives    Medications Prior to Admission  Medication Sig Dispense Refill   atorvastatin (LIPITOR) 40 MG tablet Take 40 mg by mouth every evening.      Dapagliflozin-Metformin HCl ER (XIGDUO XR) 06-999 MG TB24 Take 1 tablet by mouth every evening.      diclofenac sodium (VOLTAREN) 1 % GEL Apply 2 g topically 2 (two) times daily as needed (pain).     ezetimibe (ZETIA) 10 MG tablet Take 10 mg  by mouth daily.     FLUoxetine (PROZAC) 40 MG capsule Take 40 mg by mouth daily.     lisinopril (ZESTRIL) 10 MG tablet Take 10 mg by mouth daily.     Misc Natural Products (BLACK CHERRY CONCENTRATE PO) Take 1 tablet by mouth every evening.     mupirocin ointment (BACTROBAN) 2 % Apply 1 Application topically daily as needed (wound care).     NON FORMULARY Pt uses a cpap     omeprazole (PRILOSEC) 20 MG capsule Take 20 mg by mouth every evening.      OVER THE COUNTER MEDICATION Take 1 tablet by mouth daily. Osteopro     TOUJEO SOLOSTAR 300 UNIT/ML Solostar Pen Inject 45 Units into the skin daily.     Vibegron (GEMTESA) 75 MG TABS Take 75 mg by mouth every other day.     Insulin Pen Needle (BD PEN NEEDLE NANO 2ND GEN) 32G X 4 MM MISC USE 1 TO INJECT FOUR TIMES DAILY for 90 days     nitroGLYCERIN (NITROSTAT) 0.4 MG SL tablet Place 1  tablet (0.4 mg total) under the tongue every 5 (five) minutes as needed for chest pain. 25 tablet 3    Results for orders placed or performed during the hospital encounter of 07/17/23 (from the past 48 hour(s))  Glucose, capillary     Status: Abnormal   Collection Time: 07/17/23  7:41 AM  Result Value Ref Range   Glucose-Capillary 126 (H) 70 - 99 mg/dL    Comment: Glucose reference range applies only to samples taken after fasting for at least 8 hours.   No results found.  Review of Systems  Blood pressure (!) 175/89, pulse 67, temperature 98.4 F (36.9 C), temperature source Oral, resp. rate 17, height 5\' 9"  (1.753 m), weight 99.8 kg, SpO2 99%. Physical Exam Constitutional:      General: He is not in acute distress.    Appearance: Normal appearance.  HENT:     Head: Normocephalic and atraumatic.  Neck:     Comments: Fullness, no discrete nodule Cardiovascular:     Rate and Rhythm: Normal rate and regular rhythm.     Heart sounds: Normal heart sounds.  Pulmonary:     Effort: Pulmonary effort is normal.     Breath sounds: Normal breath sounds.  Skin:    General: Skin is warm and dry.  Neurological:     General: No focal deficit present.     Mental Status: He is alert and oriented to person, place, and time.     Cranial Nerves: No cranial nerve deficit.     Motor: No weakness.      Assessment/Plan Shawn Hicks is a 57 year old man with a past history of tobacco abuse, hypertension, hyperlipidemia, gout, type 2 diabetes without complication, obstructive sleep apnea, prostate cancer, and a goiter.   For thyroidectomy by Dr. Gerrit Friends this AM  Will be available for sternotomy if necessary  Loreli Slot, MD 07/17/2023, 8:45 AM

## 2023-07-17 NOTE — Anesthesia Procedure Notes (Signed)
Arterial Line Insertion Start/End10/28/2024 8:25 AM, 07/17/2023 8:35 AM Performed by: Marcene Duos, MD, anesthesiologist  Patient location: Pre-op. Preanesthetic checklist: patient identified, IV checked, site marked, risks and benefits discussed, surgical consent, monitors and equipment checked, pre-op evaluation, timeout performed and anesthesia consent Lidocaine 1% used for infiltration Left, radial was placed Catheter size: 20 G Hand hygiene performed  and maximum sterile barriers used   Attempts: 2 Procedure performed using ultrasound guided technique. Ultrasound Notes:anatomy identified, needle tip was noted to be adjacent to the nerve/plexus identified, no ultrasound evidence of intravascular and/or intraneural injection and image(s) printed for medical record Following insertion, dressing applied and Biopatch. Post procedure assessment: normal and unchanged  Post procedure complications: second provider assisted. Patient tolerated the procedure well with no immediate complications.

## 2023-07-17 NOTE — Interval H&P Note (Signed)
History and Physical Interval Note:  07/17/2023 8:46 AM  Shawn Hicks  has presented today for surgery, with the diagnosis of LEFT THYROID NODULE.  The various methods of treatment have been discussed with the patient and family. After consideration of risks, benefits and other options for treatment, the patient has consented to    Procedure(s): LEFT THYROID LOBECTOMY, POSSIBLE STERNAL SPLIT (N/A) possible, STERNOTOMY (N/A) as a surgical intervention.    The patient's history has been reviewed, patient examined, no change in status, stable for surgery.  I have reviewed the patient's chart and labs.  Questions were answered to the patient's satisfaction.    Darnell Level, MD [facility] Surgery [facility] practice Office: [phone]   Darnell Level

## 2023-07-17 NOTE — Anesthesia Procedure Notes (Signed)
Procedure Name: Awake intubation Date/Time: 07/17/2023 9:28 AM  Performed by: Marcene Duos, MDPre-anesthesia Checklist: Emergency Drugs available, Patient identified, Suction available and Patient being monitored Oxygen Delivery Method: Nasal cannula Induction Type: IV induction Tube type: Reinforced Tube size: 8.0 mm Number of attempts: 1 Airway Equipment and Method: Fiberoptic brochoscope Placement Confirmation: positive ETCO2 and breath sounds checked- equal and bilateral Secured at: 25 cm Tube secured with: Tape Difficulty Due To: Difficulty was anticipated Comments: Awake intubation performed 2/2 substernal thyroid mass with tracheal deviation distal to clavicles. Sedation given and 4% Lidocaine soaked cotton balls placed at bilateral tongue bases. Bilateral superior laryngeal nerve blocks performed with 3cc's 2% lidocaine on both sides. Ovosapien placed with minimal gag. 2.0 bronchoscope directed past tongue base with minimal cough. Cough encountered once scope touched vocal cords. 5cc's 4% lidocaine sprayed into trachea and onto vocal cords. Scope advanced into trachea until carina visible. ETT advanced off of scope. +BBS, +ETCO2. Tube secured and GA induced.

## 2023-07-18 ENCOUNTER — Encounter (HOSPITAL_COMMUNITY): Payer: Self-pay | Admitting: Surgery

## 2023-07-18 ENCOUNTER — Ambulatory Visit: Payer: BC Managed Care – PPO

## 2023-07-18 DIAGNOSIS — E669 Obesity, unspecified: Secondary | ICD-10-CM | POA: Diagnosis not present

## 2023-07-18 DIAGNOSIS — R1310 Dysphagia, unspecified: Secondary | ICD-10-CM | POA: Diagnosis not present

## 2023-07-18 DIAGNOSIS — Z7984 Long term (current) use of oral hypoglycemic drugs: Secondary | ICD-10-CM | POA: Diagnosis not present

## 2023-07-18 DIAGNOSIS — E049 Nontoxic goiter, unspecified: Secondary | ICD-10-CM | POA: Diagnosis not present

## 2023-07-18 DIAGNOSIS — E042 Nontoxic multinodular goiter: Secondary | ICD-10-CM | POA: Diagnosis not present

## 2023-07-18 DIAGNOSIS — I10 Essential (primary) hypertension: Secondary | ICD-10-CM | POA: Diagnosis not present

## 2023-07-18 DIAGNOSIS — Z8546 Personal history of malignant neoplasm of prostate: Secondary | ICD-10-CM | POA: Diagnosis not present

## 2023-07-18 DIAGNOSIS — E119 Type 2 diabetes mellitus without complications: Secondary | ICD-10-CM | POA: Diagnosis not present

## 2023-07-18 DIAGNOSIS — Z6833 Body mass index (BMI) 33.0-33.9, adult: Secondary | ICD-10-CM | POA: Diagnosis not present

## 2023-07-18 DIAGNOSIS — Q892 Congenital malformations of other endocrine glands: Secondary | ICD-10-CM | POA: Diagnosis not present

## 2023-07-18 MED ORDER — OXYCODONE HCL 5 MG PO TABS: 5.0000 mg | ORAL_TABLET | Freq: Four times a day (QID) | ORAL | 0 refills | Status: AC | PRN

## 2023-07-18 NOTE — Anesthesia Postprocedure Evaluation (Signed)
Anesthesia Post Note  Patient: Shawn Hicks  Procedure(s) Performed: LEFT THYROID LOBECTOMY WITH SUBSTERNAL GOITER RESECTION, RESECTION OF THRYOGLOSSAL CYST (Left: Neck)     Patient location during evaluation: PACU Anesthesia Type: General Level of consciousness: awake and alert Pain management: pain level controlled Vital Signs Assessment: post-procedure vital signs reviewed and stable Respiratory status: spontaneous breathing, nonlabored ventilation, respiratory function stable and patient connected to nasal cannula oxygen Cardiovascular status: blood pressure returned to baseline and stable Postop Assessment: no apparent nausea or vomiting Anesthetic complications: yes   Encounter Notable Events  Notable Event Outcome Phase Comment  Difficult to intubate - expected  Intraprocedure Filed from anesthesia note documentation.    Last Vitals:  Vitals:   07/18/23 0506 07/18/23 0842  BP: 131/69 133/66  Pulse: 76 67  Resp:  18  Temp: 36.7 C 36.5 C  SpO2: 96% 94%    Last Pain:  Vitals:   07/18/23 0842  TempSrc: Oral  PainSc:                  [name]

## 2023-07-18 NOTE — Discharge Instructions (Signed)

## 2023-07-18 NOTE — Plan of Care (Signed)
  Problem: Pain Management: Goal: General experience of comfort will improve Outcome: Not Progressing   Problem: Education: Goal: Knowledge of General Education information will improve Description: Including pain rating scale, medication(s)/side effects and non-pharmacologic comfort measures Outcome: Progressing   Problem: Health Behavior/Discharge Planning: Goal: Ability to manage health-related needs will improve Outcome: Progressing   Problem: Clinical Measurements: Goal: Ability to maintain clinical measurements within normal limits will improve Outcome: Progressing   Problem: Activity: Goal: Risk for activity intolerance will decrease Outcome: Progressing   Problem: Nutrition: Goal: Adequate nutrition will be maintained Outcome: Progressing   Problem: Coping: Goal: Level of anxiety will decrease Outcome: Progressing

## 2023-07-18 NOTE — Discharge Summary (Signed)
Physician Discharge Summary   Patient ID: Shawn Hicks MRN: [id number] DOB/AGE: Aug 17, 1966 57 y.o.  Admit date: 07/17/2023  Discharge date: 07/18/2023  Discharge Diagnoses:  Principal Problem:   Substernal thyroid goiter Active Problems:   Left thyroid nodule   Dysphagia   Discharged Condition: good  Hospital Course: Patient was admitted for observation following thyroid surgery.  Post op course was uncomplicated.  Pain was well controlled.  Tolerated diet.  Patient was prepared for discharge home on POD#1.  Consults: None  Treatments: surgery: left thyroid lobectomy, resection substernal goiter, resection thyroglossal duct cyst  Discharge Exam: Blood pressure 131/69, pulse 76, temperature 98 F (36.7 C), temperature source Oral, resp. rate 17, height 5\' 9"  (1.753 m), weight 99.8 kg, SpO2 96%. HEENT - clear Neck - wound dry and intact; mild STS; voice near normal; Dermabond in place  Disposition: Home  Discharge Instructions     Diet - low sodium heart healthy   Complete by: As directed    Ice pack   Complete by: As directed    Increase activity slowly   Complete by: As directed    No dressing needed   Complete by: As directed       Allergies as of 07/18/2023       Reactions   Allopurinol    Other Reaction(s): bp went up   Cephalosporins    Other Reaction(s): need to confirm which abx he was given perioperatively 5/19 and the week after. slight rash. so ,it may have been the bactrim , it may have been a perioperative abx with prostate surgery which was likely a cephalosporin   Clindamycin    Other Reaction(s): just felt bad on it. 3/18   Sulfamethoxazole-trimethoprim Hives        Medication List     TAKE these medications    atorvastatin 40 MG tablet Commonly known as: LIPITOR Take 40 mg by mouth every evening.   BD Pen Needle Nano 2nd Gen 32G X 4 MM Misc Generic drug: Insulin Pen Needle USE 1 TO INJECT FOUR TIMES DAILY for 90 days    BLACK CHERRY CONCENTRATE PO Take 1 tablet by mouth every evening.   diclofenac sodium 1 % Gel Commonly known as: VOLTAREN Apply 2 g topically 2 (two) times daily as needed (pain).   ezetimibe 10 MG tablet Commonly known as: ZETIA Take 10 mg by mouth daily.   FLUoxetine 40 MG capsule Commonly known as: PROZAC Take 40 mg by mouth daily.   Gemtesa 75 MG Tabs Generic drug: Vibegron Take 75 mg by mouth every other day.   lisinopril 10 MG tablet Commonly known as: ZESTRIL Take 10 mg by mouth daily.   mupirocin ointment 2 % Commonly known as: BACTROBAN Apply 1 Application topically daily as needed (wound care).   nitroGLYCERIN 0.4 MG SL tablet Commonly known as: NITROSTAT Place 1 tablet (0.4 mg total) under the tongue every 5 (five) minutes as needed for chest pain.   NON FORMULARY Pt uses a cpap   omeprazole 20 MG capsule Commonly known as: PRILOSEC Take 20 mg by mouth every evening.   OVER THE COUNTER MEDICATION Take 1 tablet by mouth daily. Osteopro   oxyCODONE 5 MG immediate release tablet Commonly known as: Oxy IR/ROXICODONE Take 1 tablet (5 mg total) by mouth every 6 (six) hours as needed for moderate pain (pain score 4-6).   Toujeo SoloStar 300 UNIT/ML Solostar Pen Generic drug: insulin glargine (1 Unit Dial) Inject 45 Units into the  skin daily.   Xigduo XR 06-999 MG Tb24 Generic drug: Dapagliflozin Pro-metFORMIN ER Take 1 tablet by mouth every evening.               Discharge Care Instructions  (From admission, onward)           Start     Ordered   07/18/23 0000  No dressing needed        07/18/23 4098            Follow-up Information     Darnell Level, MD. Schedule an appointment as soon as possible for a visit in 3 week(s).   Specialty: General Surgery Why: For wound re-check Contact information: [address] [place] [address] [phone]                 Darnell Level, MD [facility] Office: [phone]   Signed: Darnell Level 07/18/2023, 7:47 AM

## 2023-07-19 ENCOUNTER — Other Ambulatory Visit: Payer: Self-pay

## 2023-07-19 ENCOUNTER — Ambulatory Visit
Admission: RE | Admit: 2023-07-19 | Discharge: 2023-07-19 | Disposition: A | Discharge status: Home / Self Care | Payer: BC Managed Care – PPO | Source: Ambulatory Visit | Attending: Radiation Oncology | Admitting: Radiation Oncology

## 2023-07-19 DIAGNOSIS — C61 Malignant neoplasm of prostate: Secondary | ICD-10-CM | POA: Diagnosis not present

## 2023-07-19 DIAGNOSIS — Z510 Encounter for antineoplastic radiation therapy: Secondary | ICD-10-CM | POA: Diagnosis not present

## 2023-07-19 DIAGNOSIS — Z191 Hormone sensitive malignancy status: Secondary | ICD-10-CM | POA: Diagnosis not present

## 2023-07-19 LAB — RAD ONC ARIA SESSION SUMMARY
Course Elapsed Days: 13
Plan Fractions Treated to Date: 8
Plan Prescribed Dose Per Fraction: 1.8 Gy
Plan Total Fractions Prescribed: 25
Plan Total Prescribed Dose: 45 Gy
Reference Point Dosage Given to Date: 14.4 Gy
Reference Point Session Dosage Given: 1.8 Gy
Session Number: 8

## 2023-07-19 LAB — SURGICAL PATHOLOGY

## 2023-07-20 ENCOUNTER — Ambulatory Visit
Admission: RE | Admit: 2023-07-20 | Discharge: 2023-07-20 | Disposition: A | Discharge status: Home / Self Care | Payer: BC Managed Care – PPO | Source: Ambulatory Visit | Attending: Radiation Oncology | Admitting: Radiation Oncology

## 2023-07-20 ENCOUNTER — Other Ambulatory Visit: Payer: Self-pay

## 2023-07-20 DIAGNOSIS — Z510 Encounter for antineoplastic radiation therapy: Secondary | ICD-10-CM | POA: Diagnosis not present

## 2023-07-20 DIAGNOSIS — C61 Malignant neoplasm of prostate: Secondary | ICD-10-CM | POA: Diagnosis not present

## 2023-07-20 DIAGNOSIS — Z191 Hormone sensitive malignancy status: Secondary | ICD-10-CM | POA: Diagnosis not present

## 2023-07-20 LAB — RAD ONC ARIA SESSION SUMMARY
Course Elapsed Days: 14
Plan Fractions Treated to Date: 9
Plan Prescribed Dose Per Fraction: 1.8 Gy
Plan Total Fractions Prescribed: 25
Plan Total Prescribed Dose: 45 Gy
Reference Point Dosage Given to Date: 16.2 Gy
Reference Point Session Dosage Given: 1.8 Gy
Session Number: 9

## 2023-07-20 NOTE — Progress Notes (Signed)
Good news!  Pathology is all benign, as expected.  tmg  Darnell Level, MD [facility] practice Office: [phone]

## 2023-07-21 ENCOUNTER — Ambulatory Visit
Admission: RE | Admit: 2023-07-21 | Discharge: 2023-07-21 | Disposition: A | Discharge status: Home / Self Care | Payer: BC Managed Care – PPO | Source: Ambulatory Visit | Attending: Radiation Oncology | Admitting: Radiation Oncology

## 2023-07-21 ENCOUNTER — Other Ambulatory Visit: Payer: Self-pay

## 2023-07-21 DIAGNOSIS — C61 Malignant neoplasm of prostate: Secondary | ICD-10-CM | POA: Insufficient documentation

## 2023-07-21 DIAGNOSIS — Z510 Encounter for antineoplastic radiation therapy: Secondary | ICD-10-CM | POA: Insufficient documentation

## 2023-07-21 DIAGNOSIS — Z191 Hormone sensitive malignancy status: Secondary | ICD-10-CM | POA: Diagnosis not present

## 2023-07-21 LAB — RAD ONC ARIA SESSION SUMMARY
Course Elapsed Days: 15
Plan Fractions Treated to Date: 10
Plan Prescribed Dose Per Fraction: 1.8 Gy
Plan Total Fractions Prescribed: 25
Plan Total Prescribed Dose: 45 Gy
Reference Point Dosage Given to Date: 18 Gy
Reference Point Session Dosage Given: 1.8 Gy
Session Number: 10

## 2023-07-24 ENCOUNTER — Other Ambulatory Visit: Payer: Self-pay

## 2023-07-24 ENCOUNTER — Ambulatory Visit
Admission: RE | Admit: 2023-07-24 | Discharge: 2023-07-24 | Disposition: A | Discharge status: Home / Self Care | Payer: BC Managed Care – PPO | Source: Ambulatory Visit | Attending: Radiation Oncology

## 2023-07-24 DIAGNOSIS — C61 Malignant neoplasm of prostate: Secondary | ICD-10-CM | POA: Diagnosis not present

## 2023-07-24 DIAGNOSIS — Z191 Hormone sensitive malignancy status: Secondary | ICD-10-CM | POA: Diagnosis not present

## 2023-07-24 DIAGNOSIS — Z510 Encounter for antineoplastic radiation therapy: Secondary | ICD-10-CM | POA: Diagnosis not present

## 2023-07-24 LAB — RAD ONC ARIA SESSION SUMMARY
Course Elapsed Days: 18
Plan Fractions Treated to Date: 11
Plan Prescribed Dose Per Fraction: 1.8 Gy
Plan Total Fractions Prescribed: 25
Plan Total Prescribed Dose: 45 Gy
Reference Point Dosage Given to Date: 19.8 Gy
Reference Point Session Dosage Given: 1.8 Gy
Session Number: 11

## 2023-07-25 ENCOUNTER — Other Ambulatory Visit: Payer: Self-pay

## 2023-07-25 ENCOUNTER — Ambulatory Visit
Admission: RE | Admit: 2023-07-25 | Discharge: 2023-07-25 | Disposition: A | Discharge status: Home / Self Care | Payer: BC Managed Care – PPO | Source: Ambulatory Visit | Attending: Radiation Oncology | Admitting: Radiation Oncology

## 2023-07-25 DIAGNOSIS — Z191 Hormone sensitive malignancy status: Secondary | ICD-10-CM | POA: Diagnosis not present

## 2023-07-25 DIAGNOSIS — C61 Malignant neoplasm of prostate: Secondary | ICD-10-CM | POA: Diagnosis not present

## 2023-07-25 DIAGNOSIS — Z510 Encounter for antineoplastic radiation therapy: Secondary | ICD-10-CM | POA: Diagnosis not present

## 2023-07-25 LAB — RAD ONC ARIA SESSION SUMMARY
Course Elapsed Days: 19
Plan Fractions Treated to Date: 12
Plan Prescribed Dose Per Fraction: 1.8 Gy
Plan Total Fractions Prescribed: 25
Plan Total Prescribed Dose: 45 Gy
Reference Point Dosage Given to Date: 21.6 Gy
Reference Point Session Dosage Given: 1.8 Gy
Session Number: 12

## 2023-07-26 ENCOUNTER — Ambulatory Visit
Admission: RE | Admit: 2023-07-26 | Discharge: 2023-07-26 | Disposition: A | Discharge status: Home / Self Care | Payer: BC Managed Care – PPO | Source: Ambulatory Visit | Attending: Radiation Oncology | Admitting: Radiation Oncology

## 2023-07-26 ENCOUNTER — Other Ambulatory Visit: Payer: Self-pay

## 2023-07-26 DIAGNOSIS — Z510 Encounter for antineoplastic radiation therapy: Secondary | ICD-10-CM | POA: Diagnosis not present

## 2023-07-26 DIAGNOSIS — C61 Malignant neoplasm of prostate: Secondary | ICD-10-CM | POA: Diagnosis not present

## 2023-07-26 DIAGNOSIS — Z191 Hormone sensitive malignancy status: Secondary | ICD-10-CM | POA: Diagnosis not present

## 2023-07-26 LAB — RAD ONC ARIA SESSION SUMMARY
Course Elapsed Days: 20
Plan Fractions Treated to Date: 13
Plan Prescribed Dose Per Fraction: 1.8 Gy
Plan Total Fractions Prescribed: 25
Plan Total Prescribed Dose: 45 Gy
Reference Point Dosage Given to Date: 23.4 Gy
Reference Point Session Dosage Given: 1.8 Gy
Session Number: 13

## 2023-07-27 ENCOUNTER — Ambulatory Visit
Admission: RE | Admit: 2023-07-27 | Discharge: 2023-07-27 | Disposition: A | Discharge status: Home / Self Care | Payer: BC Managed Care – PPO | Source: Ambulatory Visit | Attending: Radiation Oncology | Admitting: Radiation Oncology

## 2023-07-27 ENCOUNTER — Ambulatory Visit: Payer: BC Managed Care – PPO

## 2023-07-27 ENCOUNTER — Other Ambulatory Visit: Payer: Self-pay

## 2023-07-27 DIAGNOSIS — Z510 Encounter for antineoplastic radiation therapy: Secondary | ICD-10-CM | POA: Diagnosis not present

## 2023-07-27 DIAGNOSIS — C61 Malignant neoplasm of prostate: Secondary | ICD-10-CM | POA: Diagnosis not present

## 2023-07-27 DIAGNOSIS — Z191 Hormone sensitive malignancy status: Secondary | ICD-10-CM | POA: Diagnosis not present

## 2023-07-27 LAB — RAD ONC ARIA SESSION SUMMARY
Course Elapsed Days: 21
Plan Fractions Treated to Date: 14
Plan Prescribed Dose Per Fraction: 1.8 Gy
Plan Total Fractions Prescribed: 25
Plan Total Prescribed Dose: 45 Gy
Reference Point Dosage Given to Date: 25.2 Gy
Reference Point Session Dosage Given: 1.8 Gy
Session Number: 14

## 2023-07-28 ENCOUNTER — Ambulatory Visit: Admission: RE | Admit: 2023-07-28 | Payer: BC Managed Care – PPO | Source: Ambulatory Visit

## 2023-07-28 ENCOUNTER — Ambulatory Visit: Payer: BC Managed Care – PPO

## 2023-07-28 ENCOUNTER — Other Ambulatory Visit: Payer: Self-pay

## 2023-07-28 ENCOUNTER — Ambulatory Visit
Admission: RE | Admit: 2023-07-28 | Discharge: 2023-07-28 | Disposition: A | Discharge status: Home / Self Care | Payer: BC Managed Care – PPO | Source: Ambulatory Visit | Attending: Radiation Oncology

## 2023-07-28 DIAGNOSIS — Z191 Hormone sensitive malignancy status: Secondary | ICD-10-CM | POA: Diagnosis not present

## 2023-07-28 DIAGNOSIS — C61 Malignant neoplasm of prostate: Secondary | ICD-10-CM | POA: Diagnosis not present

## 2023-07-28 DIAGNOSIS — Z510 Encounter for antineoplastic radiation therapy: Secondary | ICD-10-CM | POA: Diagnosis not present

## 2023-07-28 LAB — RAD ONC ARIA SESSION SUMMARY
Course Elapsed Days: 22
Plan Fractions Treated to Date: 15
Plan Prescribed Dose Per Fraction: 1.8 Gy
Plan Total Fractions Prescribed: 25
Plan Total Prescribed Dose: 45 Gy
Reference Point Dosage Given to Date: 27 Gy
Reference Point Session Dosage Given: 1.8 Gy
Session Number: 15

## 2023-07-31 ENCOUNTER — Other Ambulatory Visit: Payer: Self-pay

## 2023-07-31 ENCOUNTER — Ambulatory Visit
Admission: RE | Admit: 2023-07-31 | Discharge: 2023-07-31 | Disposition: A | Discharge status: Home / Self Care | Payer: BC Managed Care – PPO | Source: Ambulatory Visit | Attending: Radiation Oncology | Admitting: Radiation Oncology

## 2023-07-31 DIAGNOSIS — Z510 Encounter for antineoplastic radiation therapy: Secondary | ICD-10-CM | POA: Diagnosis not present

## 2023-07-31 DIAGNOSIS — C61 Malignant neoplasm of prostate: Secondary | ICD-10-CM | POA: Diagnosis not present

## 2023-07-31 DIAGNOSIS — Z191 Hormone sensitive malignancy status: Secondary | ICD-10-CM | POA: Diagnosis not present

## 2023-07-31 LAB — RAD ONC ARIA SESSION SUMMARY
Course Elapsed Days: 25
Plan Fractions Treated to Date: 16
Plan Prescribed Dose Per Fraction: 1.8 Gy
Plan Total Fractions Prescribed: 25
Plan Total Prescribed Dose: 45 Gy
Reference Point Dosage Given to Date: 28.8 Gy
Reference Point Session Dosage Given: 1.8 Gy
Session Number: 16

## 2023-08-01 ENCOUNTER — Other Ambulatory Visit: Payer: Self-pay

## 2023-08-01 ENCOUNTER — Ambulatory Visit
Admission: RE | Admit: 2023-08-01 | Discharge: 2023-08-01 | Disposition: A | Discharge status: Home / Self Care | Payer: BC Managed Care – PPO | Source: Ambulatory Visit | Attending: Radiation Oncology

## 2023-08-01 DIAGNOSIS — Z191 Hormone sensitive malignancy status: Secondary | ICD-10-CM | POA: Diagnosis not present

## 2023-08-01 DIAGNOSIS — M9903 Segmental and somatic dysfunction of lumbar region: Secondary | ICD-10-CM | POA: Diagnosis not present

## 2023-08-01 DIAGNOSIS — M9904 Segmental and somatic dysfunction of sacral region: Secondary | ICD-10-CM | POA: Diagnosis not present

## 2023-08-01 DIAGNOSIS — Z510 Encounter for antineoplastic radiation therapy: Secondary | ICD-10-CM | POA: Diagnosis not present

## 2023-08-01 DIAGNOSIS — C61 Malignant neoplasm of prostate: Secondary | ICD-10-CM | POA: Diagnosis not present

## 2023-08-01 DIAGNOSIS — M9905 Segmental and somatic dysfunction of pelvic region: Secondary | ICD-10-CM | POA: Diagnosis not present

## 2023-08-01 DIAGNOSIS — M545 Low back pain, unspecified: Secondary | ICD-10-CM | POA: Diagnosis not present

## 2023-08-01 LAB — RAD ONC ARIA SESSION SUMMARY
Course Elapsed Days: 26
Plan Fractions Treated to Date: 17
Plan Prescribed Dose Per Fraction: 1.8 Gy
Plan Total Fractions Prescribed: 25
Plan Total Prescribed Dose: 45 Gy
Reference Point Dosage Given to Date: 30.6 Gy
Reference Point Session Dosage Given: 1.8 Gy
Session Number: 17

## 2023-08-02 ENCOUNTER — Other Ambulatory Visit: Payer: Self-pay

## 2023-08-02 ENCOUNTER — Ambulatory Visit
Admission: RE | Admit: 2023-08-02 | Discharge: 2023-08-02 | Disposition: A | Discharge status: Home / Self Care | Payer: BC Managed Care – PPO | Source: Ambulatory Visit | Attending: Radiation Oncology | Admitting: Radiation Oncology

## 2023-08-02 DIAGNOSIS — C61 Malignant neoplasm of prostate: Secondary | ICD-10-CM | POA: Diagnosis not present

## 2023-08-02 DIAGNOSIS — Z191 Hormone sensitive malignancy status: Secondary | ICD-10-CM | POA: Diagnosis not present

## 2023-08-02 DIAGNOSIS — Z510 Encounter for antineoplastic radiation therapy: Secondary | ICD-10-CM | POA: Diagnosis not present

## 2023-08-02 LAB — RAD ONC ARIA SESSION SUMMARY
Course Elapsed Days: 27
Plan Fractions Treated to Date: 18
Plan Prescribed Dose Per Fraction: 1.8 Gy
Plan Total Fractions Prescribed: 25
Plan Total Prescribed Dose: 45 Gy
Reference Point Dosage Given to Date: 32.4 Gy
Reference Point Session Dosage Given: 1.8 Gy
Session Number: 18

## 2023-08-03 ENCOUNTER — Ambulatory Visit
Admission: RE | Admit: 2023-08-03 | Discharge: 2023-08-03 | Disposition: A | Discharge status: Home / Self Care | Payer: BC Managed Care – PPO | Source: Ambulatory Visit | Attending: Radiation Oncology | Admitting: Radiation Oncology

## 2023-08-03 ENCOUNTER — Other Ambulatory Visit: Payer: Self-pay

## 2023-08-03 DIAGNOSIS — Z191 Hormone sensitive malignancy status: Secondary | ICD-10-CM | POA: Diagnosis not present

## 2023-08-03 DIAGNOSIS — Z510 Encounter for antineoplastic radiation therapy: Secondary | ICD-10-CM | POA: Diagnosis not present

## 2023-08-03 DIAGNOSIS — C61 Malignant neoplasm of prostate: Secondary | ICD-10-CM | POA: Diagnosis not present

## 2023-08-03 LAB — RAD ONC ARIA SESSION SUMMARY
Course Elapsed Days: 28
Plan Fractions Treated to Date: 19
Plan Prescribed Dose Per Fraction: 1.8 Gy
Plan Total Fractions Prescribed: 25
Plan Total Prescribed Dose: 45 Gy
Reference Point Dosage Given to Date: 34.2 Gy
Reference Point Session Dosage Given: 1.8 Gy
Session Number: 19

## 2023-08-04 ENCOUNTER — Other Ambulatory Visit: Payer: Self-pay

## 2023-08-04 ENCOUNTER — Ambulatory Visit
Admission: RE | Admit: 2023-08-04 | Discharge: 2023-08-04 | Disposition: A | Discharge status: Home / Self Care | Payer: BC Managed Care – PPO | Source: Ambulatory Visit | Attending: Radiation Oncology

## 2023-08-04 DIAGNOSIS — C61 Malignant neoplasm of prostate: Secondary | ICD-10-CM | POA: Diagnosis not present

## 2023-08-04 DIAGNOSIS — Z191 Hormone sensitive malignancy status: Secondary | ICD-10-CM | POA: Diagnosis not present

## 2023-08-04 DIAGNOSIS — Z510 Encounter for antineoplastic radiation therapy: Secondary | ICD-10-CM | POA: Diagnosis not present

## 2023-08-04 LAB — RAD ONC ARIA SESSION SUMMARY
Course Elapsed Days: 29
Plan Fractions Treated to Date: 20
Plan Prescribed Dose Per Fraction: 1.8 Gy
Plan Total Fractions Prescribed: 25
Plan Total Prescribed Dose: 45 Gy
Reference Point Dosage Given to Date: 36 Gy
Reference Point Session Dosage Given: 1.8 Gy
Session Number: 20

## 2023-08-07 ENCOUNTER — Other Ambulatory Visit: Payer: Self-pay

## 2023-08-07 ENCOUNTER — Ambulatory Visit
Admission: RE | Admit: 2023-08-07 | Discharge: 2023-08-07 | Disposition: A | Discharge status: Home / Self Care | Payer: BC Managed Care – PPO | Source: Ambulatory Visit | Attending: Radiation Oncology

## 2023-08-07 DIAGNOSIS — C61 Malignant neoplasm of prostate: Secondary | ICD-10-CM | POA: Diagnosis not present

## 2023-08-07 DIAGNOSIS — Z510 Encounter for antineoplastic radiation therapy: Secondary | ICD-10-CM | POA: Diagnosis not present

## 2023-08-07 DIAGNOSIS — Z191 Hormone sensitive malignancy status: Secondary | ICD-10-CM | POA: Diagnosis not present

## 2023-08-07 LAB — RAD ONC ARIA SESSION SUMMARY
Course Elapsed Days: 32
Plan Fractions Treated to Date: 21
Plan Prescribed Dose Per Fraction: 1.8 Gy
Plan Total Fractions Prescribed: 25
Plan Total Prescribed Dose: 45 Gy
Reference Point Dosage Given to Date: 37.8 Gy
Reference Point Session Dosage Given: 1.8 Gy
Session Number: 21

## 2023-08-08 ENCOUNTER — Other Ambulatory Visit: Payer: Self-pay

## 2023-08-08 ENCOUNTER — Ambulatory Visit
Admission: RE | Admit: 2023-08-08 | Discharge: 2023-08-08 | Disposition: A | Discharge status: Home / Self Care | Payer: BC Managed Care – PPO | Source: Ambulatory Visit | Attending: Radiation Oncology | Admitting: Radiation Oncology

## 2023-08-08 DIAGNOSIS — Z191 Hormone sensitive malignancy status: Secondary | ICD-10-CM | POA: Diagnosis not present

## 2023-08-08 DIAGNOSIS — C61 Malignant neoplasm of prostate: Secondary | ICD-10-CM | POA: Diagnosis not present

## 2023-08-08 DIAGNOSIS — Z510 Encounter for antineoplastic radiation therapy: Secondary | ICD-10-CM | POA: Diagnosis not present

## 2023-08-08 LAB — RAD ONC ARIA SESSION SUMMARY
Course Elapsed Days: 33
Plan Fractions Treated to Date: 22
Plan Prescribed Dose Per Fraction: 1.8 Gy
Plan Total Fractions Prescribed: 25
Plan Total Prescribed Dose: 45 Gy
Reference Point Dosage Given to Date: 39.6 Gy
Reference Point Session Dosage Given: 1.8 Gy
Session Number: 22

## 2023-08-09 ENCOUNTER — Ambulatory Visit
Admission: RE | Admit: 2023-08-09 | Discharge: 2023-08-09 | Disposition: A | Discharge status: Home / Self Care | Payer: BC Managed Care – PPO | Source: Ambulatory Visit | Attending: Radiation Oncology | Admitting: Radiation Oncology

## 2023-08-09 ENCOUNTER — Other Ambulatory Visit: Payer: Self-pay

## 2023-08-09 DIAGNOSIS — Z191 Hormone sensitive malignancy status: Secondary | ICD-10-CM | POA: Diagnosis not present

## 2023-08-09 DIAGNOSIS — Z510 Encounter for antineoplastic radiation therapy: Secondary | ICD-10-CM | POA: Diagnosis not present

## 2023-08-09 DIAGNOSIS — C61 Malignant neoplasm of prostate: Secondary | ICD-10-CM | POA: Diagnosis not present

## 2023-08-09 LAB — RAD ONC ARIA SESSION SUMMARY
Course Elapsed Days: 34
Plan Fractions Treated to Date: 23
Plan Prescribed Dose Per Fraction: 1.8 Gy
Plan Total Fractions Prescribed: 25
Plan Total Prescribed Dose: 45 Gy
Reference Point Dosage Given to Date: 41.4 Gy
Reference Point Session Dosage Given: 1.8 Gy
Session Number: 23

## 2023-08-10 ENCOUNTER — Ambulatory Visit
Admission: RE | Admit: 2023-08-10 | Discharge: 2023-08-10 | Disposition: A | Discharge status: Home / Self Care | Payer: BC Managed Care – PPO | Source: Ambulatory Visit | Attending: Radiation Oncology | Admitting: Radiation Oncology

## 2023-08-10 ENCOUNTER — Other Ambulatory Visit: Payer: Self-pay

## 2023-08-10 DIAGNOSIS — Z510 Encounter for antineoplastic radiation therapy: Secondary | ICD-10-CM | POA: Diagnosis not present

## 2023-08-10 DIAGNOSIS — C61 Malignant neoplasm of prostate: Secondary | ICD-10-CM | POA: Diagnosis not present

## 2023-08-10 DIAGNOSIS — Z191 Hormone sensitive malignancy status: Secondary | ICD-10-CM | POA: Diagnosis not present

## 2023-08-10 LAB — RAD ONC ARIA SESSION SUMMARY
Course Elapsed Days: 35
Plan Fractions Treated to Date: 24
Plan Prescribed Dose Per Fraction: 1.8 Gy
Plan Total Fractions Prescribed: 25
Plan Total Prescribed Dose: 45 Gy
Reference Point Dosage Given to Date: 43.2 Gy
Reference Point Session Dosage Given: 1.8 Gy
Session Number: 24

## 2023-08-11 ENCOUNTER — Other Ambulatory Visit: Payer: Self-pay

## 2023-08-11 ENCOUNTER — Ambulatory Visit
Admission: RE | Admit: 2023-08-11 | Discharge: 2023-08-11 | Disposition: A | Discharge status: Home / Self Care | Payer: BC Managed Care – PPO | Source: Ambulatory Visit | Attending: Radiation Oncology | Admitting: Radiation Oncology

## 2023-08-11 DIAGNOSIS — C61 Malignant neoplasm of prostate: Secondary | ICD-10-CM | POA: Diagnosis not present

## 2023-08-11 DIAGNOSIS — Z191 Hormone sensitive malignancy status: Secondary | ICD-10-CM | POA: Diagnosis not present

## 2023-08-11 DIAGNOSIS — Z510 Encounter for antineoplastic radiation therapy: Secondary | ICD-10-CM | POA: Diagnosis not present

## 2023-08-11 LAB — RAD ONC ARIA SESSION SUMMARY
Course Elapsed Days: 36
Plan Fractions Treated to Date: 25
Plan Prescribed Dose Per Fraction: 1.8 Gy
Plan Total Fractions Prescribed: 25
Plan Total Prescribed Dose: 45 Gy
Reference Point Dosage Given to Date: 45 Gy
Reference Point Session Dosage Given: 1.8 Gy
Session Number: 25

## 2023-08-13 ENCOUNTER — Other Ambulatory Visit: Payer: Self-pay

## 2023-08-13 ENCOUNTER — Ambulatory Visit
Admission: RE | Admit: 2023-08-13 | Discharge: 2023-08-13 | Disposition: A | Discharge status: Home / Self Care | Payer: BC Managed Care – PPO | Source: Ambulatory Visit | Attending: Radiation Oncology | Admitting: Radiation Oncology

## 2023-08-13 DIAGNOSIS — C61 Malignant neoplasm of prostate: Secondary | ICD-10-CM | POA: Diagnosis not present

## 2023-08-13 DIAGNOSIS — Z191 Hormone sensitive malignancy status: Secondary | ICD-10-CM | POA: Diagnosis not present

## 2023-08-13 DIAGNOSIS — Z510 Encounter for antineoplastic radiation therapy: Secondary | ICD-10-CM | POA: Diagnosis not present

## 2023-08-13 LAB — RAD ONC ARIA SESSION SUMMARY
Course Elapsed Days: 38
Plan Fractions Treated to Date: 1
Plan Prescribed Dose Per Fraction: 1.8 Gy
Plan Total Fractions Prescribed: 13
Plan Total Prescribed Dose: 23.4 Gy
Reference Point Dosage Given to Date: 1.8 Gy
Reference Point Session Dosage Given: 1.8 Gy
Session Number: 26

## 2023-08-14 ENCOUNTER — Other Ambulatory Visit: Payer: Self-pay

## 2023-08-14 ENCOUNTER — Ambulatory Visit
Admission: RE | Admit: 2023-08-14 | Discharge: 2023-08-14 | Disposition: A | Discharge status: Home / Self Care | Payer: BC Managed Care – PPO | Source: Ambulatory Visit | Attending: Radiation Oncology | Admitting: Radiation Oncology

## 2023-08-14 ENCOUNTER — Ambulatory Visit: Payer: BC Managed Care – PPO

## 2023-08-14 DIAGNOSIS — Z191 Hormone sensitive malignancy status: Secondary | ICD-10-CM | POA: Diagnosis not present

## 2023-08-14 DIAGNOSIS — C61 Malignant neoplasm of prostate: Secondary | ICD-10-CM | POA: Diagnosis not present

## 2023-08-14 DIAGNOSIS — Z510 Encounter for antineoplastic radiation therapy: Secondary | ICD-10-CM | POA: Diagnosis not present

## 2023-08-14 LAB — RAD ONC ARIA SESSION SUMMARY
Course Elapsed Days: 39
Plan Fractions Treated to Date: 2
Plan Prescribed Dose Per Fraction: 1.8 Gy
Plan Total Fractions Prescribed: 13
Plan Total Prescribed Dose: 23.4 Gy
Reference Point Dosage Given to Date: 3.6 Gy
Reference Point Session Dosage Given: 1.8 Gy
Session Number: 27

## 2023-08-15 ENCOUNTER — Ambulatory Visit
Admission: RE | Admit: 2023-08-15 | Discharge: 2023-08-15 | Disposition: A | Discharge status: Home / Self Care | Payer: BC Managed Care – PPO | Source: Ambulatory Visit | Attending: Radiation Oncology

## 2023-08-15 ENCOUNTER — Other Ambulatory Visit: Payer: Self-pay

## 2023-08-15 DIAGNOSIS — C61 Malignant neoplasm of prostate: Secondary | ICD-10-CM | POA: Diagnosis not present

## 2023-08-15 DIAGNOSIS — Z191 Hormone sensitive malignancy status: Secondary | ICD-10-CM | POA: Diagnosis not present

## 2023-08-15 DIAGNOSIS — Z510 Encounter for antineoplastic radiation therapy: Secondary | ICD-10-CM | POA: Diagnosis not present

## 2023-08-15 LAB — RAD ONC ARIA SESSION SUMMARY
Course Elapsed Days: 40
Plan Fractions Treated to Date: 3
Plan Prescribed Dose Per Fraction: 1.8 Gy
Plan Total Fractions Prescribed: 13
Plan Total Prescribed Dose: 23.4 Gy
Reference Point Dosage Given to Date: 5.4 Gy
Reference Point Session Dosage Given: 1.8 Gy
Session Number: 28

## 2023-08-16 ENCOUNTER — Other Ambulatory Visit: Payer: Self-pay

## 2023-08-16 ENCOUNTER — Ambulatory Visit
Admission: RE | Admit: 2023-08-16 | Discharge: 2023-08-16 | Disposition: A | Discharge status: Home / Self Care | Payer: BC Managed Care – PPO | Source: Ambulatory Visit | Attending: Radiation Oncology | Admitting: Radiation Oncology

## 2023-08-16 DIAGNOSIS — Z510 Encounter for antineoplastic radiation therapy: Secondary | ICD-10-CM | POA: Diagnosis not present

## 2023-08-16 DIAGNOSIS — Z191 Hormone sensitive malignancy status: Secondary | ICD-10-CM | POA: Diagnosis not present

## 2023-08-16 DIAGNOSIS — C61 Malignant neoplasm of prostate: Secondary | ICD-10-CM | POA: Diagnosis not present

## 2023-08-16 LAB — RAD ONC ARIA SESSION SUMMARY
Course Elapsed Days: 41
Plan Fractions Treated to Date: 4
Plan Prescribed Dose Per Fraction: 1.8 Gy
Plan Total Fractions Prescribed: 13
Plan Total Prescribed Dose: 23.4 Gy
Reference Point Dosage Given to Date: 7.2 Gy
Reference Point Session Dosage Given: 1.8 Gy
Session Number: 29

## 2023-08-21 ENCOUNTER — Other Ambulatory Visit: Payer: Self-pay

## 2023-08-21 ENCOUNTER — Ambulatory Visit
Admission: RE | Admit: 2023-08-21 | Discharge: 2023-08-21 | Disposition: A | Discharge status: Home / Self Care | Payer: BC Managed Care – PPO | Source: Ambulatory Visit | Attending: Radiation Oncology | Admitting: Radiation Oncology

## 2023-08-21 ENCOUNTER — Ambulatory Visit: Payer: BC Managed Care – PPO

## 2023-08-21 DIAGNOSIS — M9903 Segmental and somatic dysfunction of lumbar region: Secondary | ICD-10-CM | POA: Diagnosis not present

## 2023-08-21 DIAGNOSIS — M9905 Segmental and somatic dysfunction of pelvic region: Secondary | ICD-10-CM | POA: Diagnosis not present

## 2023-08-21 DIAGNOSIS — M5431 Sciatica, right side: Secondary | ICD-10-CM | POA: Diagnosis not present

## 2023-08-21 DIAGNOSIS — C61 Malignant neoplasm of prostate: Secondary | ICD-10-CM | POA: Insufficient documentation

## 2023-08-21 DIAGNOSIS — Z191 Hormone sensitive malignancy status: Secondary | ICD-10-CM | POA: Diagnosis not present

## 2023-08-21 DIAGNOSIS — Z510 Encounter for antineoplastic radiation therapy: Secondary | ICD-10-CM | POA: Diagnosis not present

## 2023-08-21 DIAGNOSIS — M9904 Segmental and somatic dysfunction of sacral region: Secondary | ICD-10-CM | POA: Diagnosis not present

## 2023-08-21 LAB — RAD ONC ARIA SESSION SUMMARY
Course Elapsed Days: 46
Plan Fractions Treated to Date: 5
Plan Prescribed Dose Per Fraction: 1.8 Gy
Plan Total Fractions Prescribed: 13
Plan Total Prescribed Dose: 23.4 Gy
Reference Point Dosage Given to Date: 9 Gy
Reference Point Session Dosage Given: 1.8 Gy
Session Number: 30

## 2023-08-22 ENCOUNTER — Other Ambulatory Visit: Payer: Self-pay

## 2023-08-22 ENCOUNTER — Ambulatory Visit
Admission: RE | Admit: 2023-08-22 | Discharge: 2023-08-22 | Disposition: A | Discharge status: Home / Self Care | Payer: BC Managed Care – PPO | Source: Ambulatory Visit | Attending: Radiation Oncology | Admitting: Radiation Oncology

## 2023-08-22 DIAGNOSIS — Z191 Hormone sensitive malignancy status: Secondary | ICD-10-CM | POA: Diagnosis not present

## 2023-08-22 DIAGNOSIS — C61 Malignant neoplasm of prostate: Secondary | ICD-10-CM | POA: Diagnosis not present

## 2023-08-22 DIAGNOSIS — Z510 Encounter for antineoplastic radiation therapy: Secondary | ICD-10-CM | POA: Diagnosis not present

## 2023-08-22 LAB — RAD ONC ARIA SESSION SUMMARY
Course Elapsed Days: 47
Plan Fractions Treated to Date: 6
Plan Prescribed Dose Per Fraction: 1.8 Gy
Plan Total Fractions Prescribed: 13
Plan Total Prescribed Dose: 23.4 Gy
Reference Point Dosage Given to Date: 10.8 Gy
Reference Point Session Dosage Given: 1.8 Gy
Session Number: 31

## 2023-08-23 ENCOUNTER — Ambulatory Visit
Admission: RE | Admit: 2023-08-23 | Discharge: 2023-08-23 | Disposition: A | Discharge status: Home / Self Care | Payer: BC Managed Care – PPO | Source: Ambulatory Visit | Attending: Radiation Oncology | Admitting: Radiation Oncology

## 2023-08-23 ENCOUNTER — Other Ambulatory Visit: Payer: Self-pay

## 2023-08-23 DIAGNOSIS — Z191 Hormone sensitive malignancy status: Secondary | ICD-10-CM | POA: Diagnosis not present

## 2023-08-23 DIAGNOSIS — C61 Malignant neoplasm of prostate: Secondary | ICD-10-CM | POA: Diagnosis not present

## 2023-08-23 DIAGNOSIS — Z510 Encounter for antineoplastic radiation therapy: Secondary | ICD-10-CM | POA: Diagnosis not present

## 2023-08-23 LAB — RAD ONC ARIA SESSION SUMMARY
Course Elapsed Days: 48
Plan Fractions Treated to Date: 7
Plan Prescribed Dose Per Fraction: 1.8 Gy
Plan Total Fractions Prescribed: 13
Plan Total Prescribed Dose: 23.4 Gy
Reference Point Dosage Given to Date: 12.6 Gy
Reference Point Session Dosage Given: 1.8 Gy
Session Number: 32

## 2023-08-24 ENCOUNTER — Other Ambulatory Visit: Payer: Self-pay

## 2023-08-24 ENCOUNTER — Ambulatory Visit
Admission: RE | Admit: 2023-08-24 | Discharge: 2023-08-24 | Disposition: A | Discharge status: Home / Self Care | Payer: BC Managed Care – PPO | Source: Ambulatory Visit | Attending: Radiation Oncology | Admitting: Radiation Oncology

## 2023-08-24 DIAGNOSIS — Z510 Encounter for antineoplastic radiation therapy: Secondary | ICD-10-CM | POA: Diagnosis not present

## 2023-08-24 DIAGNOSIS — Z191 Hormone sensitive malignancy status: Secondary | ICD-10-CM | POA: Diagnosis not present

## 2023-08-24 DIAGNOSIS — C61 Malignant neoplasm of prostate: Secondary | ICD-10-CM | POA: Diagnosis not present

## 2023-08-24 LAB — RAD ONC ARIA SESSION SUMMARY
Course Elapsed Days: 49
Plan Fractions Treated to Date: 8
Plan Prescribed Dose Per Fraction: 1.8 Gy
Plan Total Fractions Prescribed: 13
Plan Total Prescribed Dose: 23.4 Gy
Reference Point Dosage Given to Date: 14.4 Gy
Reference Point Session Dosage Given: 1.8 Gy
Session Number: 33

## 2023-08-25 ENCOUNTER — Ambulatory Visit
Admission: RE | Admit: 2023-08-25 | Discharge: 2023-08-25 | Disposition: A | Discharge status: Home / Self Care | Payer: BC Managed Care – PPO | Source: Ambulatory Visit | Attending: Radiation Oncology | Admitting: Radiation Oncology

## 2023-08-25 ENCOUNTER — Ambulatory Visit
Admission: RE | Admit: 2023-08-25 | Discharge: 2023-08-25 | Disposition: A | Discharge status: Home / Self Care | Payer: BC Managed Care – PPO | Source: Ambulatory Visit | Attending: Radiation Oncology

## 2023-08-25 ENCOUNTER — Ambulatory Visit: Payer: BC Managed Care – PPO

## 2023-08-25 ENCOUNTER — Other Ambulatory Visit: Payer: Self-pay

## 2023-08-25 DIAGNOSIS — Z510 Encounter for antineoplastic radiation therapy: Secondary | ICD-10-CM | POA: Diagnosis not present

## 2023-08-25 DIAGNOSIS — Z191 Hormone sensitive malignancy status: Secondary | ICD-10-CM | POA: Diagnosis not present

## 2023-08-25 DIAGNOSIS — C61 Malignant neoplasm of prostate: Secondary | ICD-10-CM | POA: Diagnosis not present

## 2023-08-25 LAB — RAD ONC ARIA SESSION SUMMARY
Course Elapsed Days: 50
Plan Fractions Treated to Date: 9
Plan Prescribed Dose Per Fraction: 1.8 Gy
Plan Total Fractions Prescribed: 13
Plan Total Prescribed Dose: 23.4 Gy
Reference Point Dosage Given to Date: 16.2 Gy
Reference Point Session Dosage Given: 1.8 Gy
Session Number: 34

## 2023-08-28 ENCOUNTER — Ambulatory Visit
Admission: RE | Admit: 2023-08-28 | Discharge: 2023-08-28 | Disposition: A | Discharge status: Home / Self Care | Payer: BC Managed Care – PPO | Source: Ambulatory Visit | Attending: Radiation Oncology | Admitting: Radiation Oncology

## 2023-08-28 ENCOUNTER — Other Ambulatory Visit: Payer: Self-pay

## 2023-08-28 DIAGNOSIS — Z510 Encounter for antineoplastic radiation therapy: Secondary | ICD-10-CM | POA: Diagnosis not present

## 2023-08-28 DIAGNOSIS — Z191 Hormone sensitive malignancy status: Secondary | ICD-10-CM | POA: Diagnosis not present

## 2023-08-28 DIAGNOSIS — C61 Malignant neoplasm of prostate: Secondary | ICD-10-CM | POA: Diagnosis not present

## 2023-08-28 LAB — RAD ONC ARIA SESSION SUMMARY
Course Elapsed Days: 53
Plan Fractions Treated to Date: 10
Plan Prescribed Dose Per Fraction: 1.8 Gy
Plan Total Fractions Prescribed: 13
Plan Total Prescribed Dose: 23.4 Gy
Reference Point Dosage Given to Date: 18 Gy
Reference Point Session Dosage Given: 1.8 Gy
Session Number: 35

## 2023-08-29 ENCOUNTER — Ambulatory Visit
Admission: RE | Admit: 2023-08-29 | Discharge: 2023-08-29 | Disposition: A | Discharge status: Home / Self Care | Payer: BC Managed Care – PPO | Source: Ambulatory Visit | Attending: Radiation Oncology | Admitting: Radiation Oncology

## 2023-08-29 ENCOUNTER — Other Ambulatory Visit: Payer: Self-pay

## 2023-08-29 DIAGNOSIS — C61 Malignant neoplasm of prostate: Secondary | ICD-10-CM | POA: Diagnosis not present

## 2023-08-29 DIAGNOSIS — Z510 Encounter for antineoplastic radiation therapy: Secondary | ICD-10-CM | POA: Diagnosis not present

## 2023-08-29 DIAGNOSIS — Z191 Hormone sensitive malignancy status: Secondary | ICD-10-CM | POA: Diagnosis not present

## 2023-08-29 LAB — RAD ONC ARIA SESSION SUMMARY
Course Elapsed Days: 54
Plan Fractions Treated to Date: 11
Plan Prescribed Dose Per Fraction: 1.8 Gy
Plan Total Fractions Prescribed: 13
Plan Total Prescribed Dose: 23.4 Gy
Reference Point Dosage Given to Date: 19.8 Gy
Reference Point Session Dosage Given: 1.8 Gy
Session Number: 36

## 2023-08-30 ENCOUNTER — Other Ambulatory Visit: Payer: Self-pay

## 2023-08-30 ENCOUNTER — Ambulatory Visit
Admission: RE | Admit: 2023-08-30 | Discharge: 2023-08-30 | Disposition: A | Discharge status: Home / Self Care | Payer: BC Managed Care – PPO | Source: Ambulatory Visit | Attending: Radiation Oncology | Admitting: Radiation Oncology

## 2023-08-30 DIAGNOSIS — Z510 Encounter for antineoplastic radiation therapy: Secondary | ICD-10-CM | POA: Diagnosis not present

## 2023-08-30 DIAGNOSIS — C61 Malignant neoplasm of prostate: Secondary | ICD-10-CM | POA: Diagnosis not present

## 2023-08-30 DIAGNOSIS — Z191 Hormone sensitive malignancy status: Secondary | ICD-10-CM | POA: Diagnosis not present

## 2023-08-30 LAB — RAD ONC ARIA SESSION SUMMARY
Course Elapsed Days: 55
Plan Fractions Treated to Date: 12
Plan Prescribed Dose Per Fraction: 1.8 Gy
Plan Total Fractions Prescribed: 13
Plan Total Prescribed Dose: 23.4 Gy
Reference Point Dosage Given to Date: 21.6 Gy
Reference Point Session Dosage Given: 1.8 Gy
Session Number: 37

## 2023-08-31 ENCOUNTER — Ambulatory Visit: Payer: BC Managed Care – PPO

## 2023-08-31 ENCOUNTER — Ambulatory Visit
Admission: RE | Admit: 2023-08-31 | Discharge: 2023-08-31 | Disposition: A | Discharge status: Home / Self Care | Payer: BC Managed Care – PPO | Source: Ambulatory Visit | Attending: Radiation Oncology | Admitting: Radiation Oncology

## 2023-08-31 ENCOUNTER — Other Ambulatory Visit: Payer: Self-pay

## 2023-08-31 DIAGNOSIS — Z191 Hormone sensitive malignancy status: Secondary | ICD-10-CM | POA: Diagnosis not present

## 2023-08-31 DIAGNOSIS — C61 Malignant neoplasm of prostate: Secondary | ICD-10-CM | POA: Diagnosis not present

## 2023-08-31 DIAGNOSIS — Z510 Encounter for antineoplastic radiation therapy: Secondary | ICD-10-CM | POA: Diagnosis not present

## 2023-08-31 LAB — RAD ONC ARIA SESSION SUMMARY
Course Elapsed Days: 56
Plan Fractions Treated to Date: 13
Plan Prescribed Dose Per Fraction: 1.8 Gy
Plan Total Fractions Prescribed: 13
Plan Total Prescribed Dose: 23.4 Gy
Reference Point Dosage Given to Date: 23.4 Gy
Reference Point Session Dosage Given: 1.8 Gy
Session Number: 38

## 2023-08-31 NOTE — Progress Notes (Signed)
Hematuria  Patient identity verified x2.  Patient reports moderate hematuria x 24 hrs, right at the end of his urine stream w/o pain. Patient completed radiation on 08/31/2023. I have notified patients urologist, Dr. Marlou Porch via nurse Morrie Sheldon at [facility] [phone]. She states "She will call patient now at [phone], to set up an apt. 1st available is January 2025, but she is going to try to get him in sooner. I did not recommend any new OTC medications to patient for this issue, due to the underlying cause being unidentified at this time. I left my extension [phone] with Ms. Morrie Sheldon and the patient in case needed.  Understanding expressed by all parties.  This concludes the interaction.  Ruel Favors, LPN

## 2023-09-01 ENCOUNTER — Ambulatory Visit: Payer: BC Managed Care – PPO

## 2023-09-01 DIAGNOSIS — Z23 Encounter for immunization: Secondary | ICD-10-CM | POA: Diagnosis not present

## 2023-09-01 DIAGNOSIS — E1169 Type 2 diabetes mellitus with other specified complication: Secondary | ICD-10-CM | POA: Diagnosis not present

## 2023-09-01 NOTE — Radiation Completion Notes (Signed)
Patient Name: Shawn Hicks, Shawn Hicks MRN: [id number] Date of Birth: 09/08/1966 Referring Physician: Berniece Salines, M.D. Date of Service: 2023-09-01 Radiation Oncologist: Margaretmary Bayley, M.D. [facility]                             RADIATION ONCOLOGY END OF TREATMENT NOTE     Diagnosis: C61 Malignant neoplasm of prostate Staging on 2023-01-20: Malignant neoplasm of prostate [facility]) T=pT3a, N=pN0, M=cM1b Staging on 2017-08-18: Malignant neoplasm of prostate (HCC) T=cT1c, N=cN0, M=cM0 Intent: Curative     ==========DELIVERED PLANS==========  First Treatment Date: 2023-07-06 Last Treatment Date: 2023-08-31   Plan Name: Prostate_Pelv Site: Prostate Bed Technique: IMRT Mode: Photon Dose Per Fraction: 1.8 Gy Prescribed Dose (Delivered / Prescribed): 45 Gy / 45 Gy Prescribed Fxs (Delivered / Prescribed): 25 / 25   Plan Name: Prostate_Bst Site: Prostate Bed Technique: IMRT Mode: Photon Dose Per Fraction: 1.8 Gy Prescribed Dose (Delivered / Prescribed): 23.4 Gy / 23.4 Gy Prescribed Fxs (Delivered / Prescribed): 13 / 13     ==========ON TREATMENT VISIT DATES========== 2023-07-07, 2023-07-14, 2023-07-28, 2023-08-03, 2023-08-11, 2023-08-21, 2023-08-25, 2023-08-30     ==========UPCOMING VISITS==========       ==========APPENDIX - ON TREATMENT VISIT NOTES==========   See weekly On Treatment Notes in Epic for details in the Media tab (listed as Progress notes on the On Treatment Visit Dates listed above).

## 2023-09-04 DIAGNOSIS — M5431 Sciatica, right side: Secondary | ICD-10-CM | POA: Diagnosis not present

## 2023-09-04 DIAGNOSIS — M9905 Segmental and somatic dysfunction of pelvic region: Secondary | ICD-10-CM | POA: Diagnosis not present

## 2023-09-04 DIAGNOSIS — M9903 Segmental and somatic dysfunction of lumbar region: Secondary | ICD-10-CM | POA: Diagnosis not present

## 2023-09-04 DIAGNOSIS — M9904 Segmental and somatic dysfunction of sacral region: Secondary | ICD-10-CM | POA: Diagnosis not present

## 2023-09-22 NOTE — Progress Notes (Signed)
 Patient was a RadOnc Consult on 06/12/23 for his rising PSA of 0.5 s/p RALP 01/2018.  Patient proceed with treatment recommendations of 7.5 weeks of salvage external beam therapy targeted at the prostate bed, surrounding lymph nodes, and left iliac lesion and had his final radiation treatment on 08/31/2023.   Patient is scheduled for a post treatment nurse call on 10/03/23 and currently does not have any follow up's with urology.   RN left message for call back to discuss post treatment education and review getting appointments with urology.

## 2023-09-27 DIAGNOSIS — Z9009 Acquired absence of other part of head and neck: Secondary | ICD-10-CM | POA: Diagnosis not present

## 2023-10-02 ENCOUNTER — Telehealth: Payer: Self-pay | Admitting: *Deleted

## 2023-10-02 NOTE — Telephone Encounter (Signed)
 RETURNED PATIENT'S PHONE CALL, SPOKE WITH PATIENT. ?

## 2023-10-03 ENCOUNTER — Ambulatory Visit: Payer: BC Managed Care – PPO

## 2023-10-03 ENCOUNTER — Ambulatory Visit
Admission: RE | Admit: 2023-10-03 | Discharge: 2023-10-03 | Disposition: A | Discharge status: Home / Self Care | Payer: BC Managed Care – PPO | Source: Ambulatory Visit | Attending: Radiation Oncology | Admitting: Radiation Oncology

## 2023-10-03 DIAGNOSIS — C61 Malignant neoplasm of prostate: Secondary | ICD-10-CM | POA: Insufficient documentation

## 2023-10-03 DIAGNOSIS — Z510 Encounter for antineoplastic radiation therapy: Secondary | ICD-10-CM | POA: Insufficient documentation

## 2023-10-03 NOTE — Progress Notes (Signed)
  Radiation Oncology         [phone]) [phone] ________________________________  Name: Shawn Hicks MRN: [id number]  Date of Service: 10/03/2023  DOB: November 26, 1965  Post Treatment Telephone Note  Diagnosis:  C61 Malignant neoplasm of prostate (as documented in provider EOT note)  Pre Treatment IPSS Score: 5 (as documented in the provider consult note)  The patient was not available for call today. Voicemail left.  Patient has a scheduled follow up visit with his urologist, Dr. Cam, on 11/2023 for ongoing surveillance. He was counseled that PSA levels will be drawn in the urology office, and was reassured that additional time is expected to improve bowel and bladder symptoms. He was encouraged to call back with concerns or questions regarding radiation.    Rosaline Minerva, LPN

## 2023-10-13 DIAGNOSIS — R946 Abnormal results of thyroid function studies: Secondary | ICD-10-CM | POA: Diagnosis not present

## 2023-10-16 NOTE — Progress Notes (Signed)
RN left message for call back to review need for urology follow up's.

## 2023-10-23 NOTE — Progress Notes (Signed)
RN has been unsuccessful with being able to reach patient to review post treatment recommendations/follow up's.  Letter mailed out to patient.

## 2023-12-11 DIAGNOSIS — Z8546 Personal history of malignant neoplasm of prostate: Secondary | ICD-10-CM | POA: Diagnosis not present

## 2023-12-18 DIAGNOSIS — N3942 Incontinence without sensory awareness: Secondary | ICD-10-CM | POA: Diagnosis not present

## 2023-12-18 DIAGNOSIS — N5231 Erectile dysfunction following radical prostatectomy: Secondary | ICD-10-CM | POA: Diagnosis not present

## 2023-12-18 DIAGNOSIS — Z8546 Personal history of malignant neoplasm of prostate: Secondary | ICD-10-CM | POA: Diagnosis not present

## 2023-12-25 DIAGNOSIS — I10 Essential (primary) hypertension: Secondary | ICD-10-CM | POA: Diagnosis not present

## 2023-12-25 DIAGNOSIS — E1169 Type 2 diabetes mellitus with other specified complication: Secondary | ICD-10-CM | POA: Diagnosis not present

## 2024-03-04 DIAGNOSIS — Z8546 Personal history of malignant neoplasm of prostate: Secondary | ICD-10-CM | POA: Diagnosis not present

## 2024-04-25 DIAGNOSIS — N393 Stress incontinence (female) (male): Secondary | ICD-10-CM | POA: Diagnosis not present

## 2024-04-25 DIAGNOSIS — Z8546 Personal history of malignant neoplasm of prostate: Secondary | ICD-10-CM | POA: Diagnosis not present

## 2024-04-25 DIAGNOSIS — R9721 Rising PSA following treatment for malignant neoplasm of prostate: Secondary | ICD-10-CM | POA: Diagnosis not present

## 2024-07-22 DIAGNOSIS — R9721 Rising PSA following treatment for malignant neoplasm of prostate: Secondary | ICD-10-CM | POA: Diagnosis not present

## 2024-07-29 DIAGNOSIS — Z8546 Personal history of malignant neoplasm of prostate: Secondary | ICD-10-CM | POA: Diagnosis not present

## 2024-07-29 DIAGNOSIS — N393 Stress incontinence (female) (male): Secondary | ICD-10-CM | POA: Diagnosis not present

## 2024-08-19 DIAGNOSIS — Z1212 Encounter for screening for malignant neoplasm of rectum: Secondary | ICD-10-CM | POA: Diagnosis not present

## 2024-08-19 DIAGNOSIS — E291 Testicular hypofunction: Secondary | ICD-10-CM | POA: Diagnosis not present

## 2024-08-22 DIAGNOSIS — R82998 Other abnormal findings in urine: Secondary | ICD-10-CM | POA: Diagnosis not present

## 2024-08-22 DIAGNOSIS — Z0000 Encounter for general adult medical examination without abnormal findings: Secondary | ICD-10-CM | POA: Diagnosis not present

## 2024-08-22 DIAGNOSIS — E039 Hypothyroidism, unspecified: Secondary | ICD-10-CM | POA: Diagnosis not present

## 2024-08-22 DIAGNOSIS — I10 Essential (primary) hypertension: Secondary | ICD-10-CM | POA: Diagnosis not present

## 2024-08-22 DIAGNOSIS — Z23 Encounter for immunization: Secondary | ICD-10-CM | POA: Diagnosis not present
